# Patient Record
Sex: Female | Born: 1985
Health system: Southern US, Community
[De-identification: ages and names within clinical notes are randomized; demographics above are authoritative.]

## PROBLEM LIST (undated history)

## (undated) DIAGNOSIS — IMO0002 Reserved for concepts with insufficient information to code with codable children: Secondary | ICD-10-CM

## (undated) DIAGNOSIS — F988 Other specified behavioral and emotional disorders with onset usually occurring in childhood and adolescence: Secondary | ICD-10-CM

## (undated) DIAGNOSIS — Z973 Presence of spectacles and contact lenses: Secondary | ICD-10-CM

## (undated) DIAGNOSIS — N939 Abnormal uterine and vaginal bleeding, unspecified: Secondary | ICD-10-CM

## (undated) DIAGNOSIS — D259 Leiomyoma of uterus, unspecified: Secondary | ICD-10-CM

## (undated) DIAGNOSIS — A64 Unspecified sexually transmitted disease: Secondary | ICD-10-CM

## (undated) DIAGNOSIS — M1990 Unspecified osteoarthritis, unspecified site: Secondary | ICD-10-CM

## (undated) DIAGNOSIS — F329 Major depressive disorder, single episode, unspecified: Secondary | ICD-10-CM

## (undated) DIAGNOSIS — L237 Allergic contact dermatitis due to plants, except food: Secondary | ICD-10-CM

## (undated) DIAGNOSIS — N920 Excessive and frequent menstruation with regular cycle: Secondary | ICD-10-CM

## (undated) DIAGNOSIS — F32A Depression, unspecified: Secondary | ICD-10-CM

## (undated) HISTORY — DX: Depression, unspecified: F32.A

## (undated) HISTORY — DX: Allergic contact dermatitis due to plants, except food: L23.7

## (undated) HISTORY — DX: Unspecified sexually transmitted disease: A64

## (undated) HISTORY — DX: Other specified behavioral and emotional disorders with onset usually occurring in childhood and adolescence: F98.8

## (undated) HISTORY — PX: NO PAST SURGERIES: SHX2092

## (undated) HISTORY — DX: Reserved for concepts with insufficient information to code with codable children: IMO0002

## (undated) HISTORY — DX: Major depressive disorder, single episode, unspecified: F32.9

---
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
## 2004-02-08 ENCOUNTER — Ambulatory Visit: Payer: Self-pay | Admitting: Pediatrics

## 2004-02-19 DIAGNOSIS — A64 Unspecified sexually transmitted disease: Secondary | ICD-10-CM

## 2004-02-19 DIAGNOSIS — Z8619 Personal history of other infectious and parasitic diseases: Secondary | ICD-10-CM

## 2004-02-19 HISTORY — DX: Unspecified sexually transmitted disease: A64

## 2004-02-19 HISTORY — DX: Personal history of other infectious and parasitic diseases: Z86.19

## 2004-03-05 ENCOUNTER — Other Ambulatory Visit: Admission: RE | Admit: 2004-03-05 | Discharge: 2004-03-05 | Payer: Self-pay | Admitting: Obstetrics and Gynecology

## 2004-06-04 ENCOUNTER — Ambulatory Visit: Payer: Self-pay | Admitting: Pediatrics

## 2005-02-06 ENCOUNTER — Ambulatory Visit: Payer: Self-pay | Admitting: Pediatrics

## 2007-03-02 ENCOUNTER — Other Ambulatory Visit: Admission: RE | Admit: 2007-03-02 | Discharge: 2007-03-02 | Payer: Self-pay | Admitting: Obstetrics and Gynecology

## 2008-05-02 ENCOUNTER — Emergency Department (HOSPITAL_COMMUNITY): Admission: EM | Admit: 2008-05-02 | Discharge: 2008-05-02 | Payer: Self-pay | Admitting: Emergency Medicine

## 2008-05-16 ENCOUNTER — Other Ambulatory Visit: Admission: RE | Admit: 2008-05-16 | Discharge: 2008-05-16 | Payer: Self-pay | Admitting: Obstetrics and Gynecology

## 2008-12-10 ENCOUNTER — Emergency Department (HOSPITAL_COMMUNITY): Admission: EM | Admit: 2008-12-10 | Discharge: 2008-12-10 | Payer: Self-pay | Admitting: Emergency Medicine

## 2008-12-23 ENCOUNTER — Ambulatory Visit: Payer: Self-pay | Admitting: Internal Medicine

## 2008-12-23 DIAGNOSIS — L301 Dyshidrosis [pompholyx]: Secondary | ICD-10-CM | POA: Insufficient documentation

## 2008-12-23 DIAGNOSIS — F341 Dysthymic disorder: Secondary | ICD-10-CM | POA: Insufficient documentation

## 2008-12-30 ENCOUNTER — Telehealth (INDEPENDENT_AMBULATORY_CARE_PROVIDER_SITE_OTHER): Payer: Self-pay | Admitting: *Deleted

## 2009-01-03 ENCOUNTER — Ambulatory Visit: Payer: Self-pay | Admitting: Licensed Clinical Social Worker

## 2009-01-10 ENCOUNTER — Ambulatory Visit: Payer: Self-pay | Admitting: Licensed Clinical Social Worker

## 2009-01-20 ENCOUNTER — Ambulatory Visit: Payer: Self-pay | Admitting: Internal Medicine

## 2009-01-20 LAB — CONVERTED CEMR LAB
CO2: 33 meq/L — ABNORMAL HIGH (ref 19–32)
Chloride: 100 meq/L (ref 96–112)
Creatinine, Ser: 0.8 mg/dL (ref 0.4–1.2)
LDL Cholesterol: 106 mg/dL — ABNORMAL HIGH (ref 0–99)
Potassium: 4.1 meq/L (ref 3.5–5.1)
Sodium: 139 meq/L (ref 135–145)
Total CHOL/HDL Ratio: 3

## 2009-01-21 ENCOUNTER — Encounter: Payer: Self-pay | Admitting: Internal Medicine

## 2009-01-24 ENCOUNTER — Ambulatory Visit: Payer: Self-pay | Admitting: Licensed Clinical Social Worker

## 2009-03-21 ENCOUNTER — Ambulatory Visit: Payer: Self-pay | Admitting: Licensed Clinical Social Worker

## 2009-03-24 ENCOUNTER — Ambulatory Visit: Payer: Self-pay | Admitting: Internal Medicine

## 2009-04-05 ENCOUNTER — Ambulatory Visit: Payer: Self-pay | Admitting: Internal Medicine

## 2009-04-05 LAB — CONVERTED CEMR LAB: Rapid Strep: NEGATIVE

## 2009-08-09 ENCOUNTER — Ambulatory Visit: Payer: Self-pay | Admitting: Internal Medicine

## 2010-03-20 NOTE — Assessment & Plan Note (Signed)
Summary: INJ/GARDISIL--MEN  STC  Nurse Visit   Allergies: No Known Drug Allergies  Immunizations Administered:  HPV # 3:    Vaccine Type: Gardasil    Site: right deltoid    Mfr: Merck    Dose: 0.5 ml    Route: IM    Given by: Ami Bullins CMA    Exp. Date: 12/03/2010    Lot #: [id number]    VIS given: 03/22/05 version given August 09, 2009.  Orders Added: 1)  HPV Vaccine - 3 sched doses - IM [90649] 2)  Admin 1st Vaccine [60454]

## 2010-03-20 NOTE — Assessment & Plan Note (Signed)
Summary: PER PT FEB FU--STC   Vital Signs:  Patient profile:   25 year old female Height:      63 inches Weight:      148 pounds BMI:     26.31 O2 Sat:      97 % on Room air Temp:     98.5 degrees F oral Pulse rate:   82 / minute BP sitting:   118 / 82  (left arm) Cuff size:   regular  Vitals Entered By: Bill Salinas CMA (March 24, 2009 3:26 PM)  O2 Flow:  Room air   Primary Care Provider:  Jacques Navy MD   History of Present Illness: follow-up for management of anxiety and depression. she has been doing better with fluoxetine. She is interested in stopping medication at this time.  She complains of failure of her grip followed by paresthesia at the base of the thumbs. When lifting heavy objects at work or doing hard repetitive motion work she will find that she looses her grip followed by the sudden on-set of "pins & needles" in her thumb that will last 5-10 minutes. this will happen 2-3 times a month. No intercurrent symptoms.   Current Medications (verified): 1)  Fluoxetine Hcl 20 Mg Caps (Fluoxetine Hcl) .Marland Kitchen.. 1 By Mouth At Bedtime  Allergies (verified): No Known Drug Allergies PMH-FH-SH reviewed-no changes except otherwise noted  Review of Systems  The patient denies anorexia, fever, weight loss, hoarseness, chest pain, dyspnea on exertion, headaches, abdominal pain, muscle weakness, difficulty walking, abnormal bleeding, and angioedema.    Physical Exam  General:  Well-developed,well-nourished,in no acute distress; alert,appropriate and cooperative throughout examination Head:  normocephalic and no abnormalities observed.   Eyes:  vision grossly intact and pupils equal.   Ears:  R ear normal and L ear normal.   Mouth:  good dentition and no gingival abnormalities.   Neck:  supple and full ROM.   Msk:  negative tinel's, negative Phalen's, normal grip strength, normal "O" test.   Impression & Recommendations:  Problem # 1:  DEPRESSION/ANXIETY  (ICD-300.4) Doing much better by her report.  Plan - stop fluoxetine. For recurrent symptoms may resume medication. Have asked her to notify me if she resumes medication.  Problem # 2:  PARESTHESIA, HANDS (ICD-782.0) Normal exam. suspect discomfort is a consequence of hand strain. No additional testing or intervention at this time.   Other Orders: HPV Vaccine - 3 sched doses - IM (40347) Admin 1st Vaccine (42595)   Immunizations Administered:  HPV # 2:    Vaccine Type: Gardasil    Site: left deltoid    Mfr: Merck    Dose: 0.5 ml    Route: IM    Given by: Ami Bullins CMA    Exp. Date: 12/03/2010    Lot #: [id number]    VIS given: 03/22/05 version given March 24, 2009.

## 2010-03-20 NOTE — Assessment & Plan Note (Signed)
Summary: ?STREP THROAT/OFFERED SDA REFUSED/CD   Vital Signs:  Patient profile:   25 year old female Height:      63 inches Weight:      149 pounds BMI:     26.49 O2 Sat:      97 % on Room air Temp:     98.4 degrees F oral Pulse rate:   74 / minute BP sitting:   118 / 82  (left arm) Cuff size:   regular  Vitals Entered By: Bill Salinas CMA (April 05, 2009 11:28 AM)  O2 Flow:  Room air CC: pt here with complaint of weakness and sore throat with welps on her tongue x 2 days/ ab   Primary Care Provider:  Jacques Navy MD  CC:  pt here with complaint of weakness and sore throat with welps on her tongue x 2 days/ ab.  History of Present Illness: c/o feeling ill and low on energy for 6+ weeks. she has now got a sore throat and tongue. she denies running fever, cough, SOB, enlarged lymph nodes.  Current Medications (verified): 1)  None  Allergies (verified): No Known Drug Allergies PMH-FH-SH reviewed-no changes except otherwise noted  Review of Systems  The patient denies anorexia, fever, weight loss, hoarseness, chest pain, dyspnea on exertion, abdominal pain, muscle weakness, depression, enlarged lymph nodes, and angioedema.    Physical Exam  General:  WNWD white female in no distress Head:  Normocephalic and atraumatic without obvious abnormalities. No apparent alopecia or balding. Mouth:  tongue with normal papillation. No lesions. Posterior pharynx normal   Impression & Recommendations:  Problem # 1:  SORE THROAT (ICD-462) tient without evidence of bacterial pharyngits - suspect viral illness with sore tongue. No need for antibiotics. She may also have some degree of allergy  Plan - supportive care only           trial of claritin 10 mg daily  Laboratory Results   Date/Time Reported: 04/05/2009 11:40am  Other Tests  Rapid Strep: negative

## 2010-11-03 ENCOUNTER — Encounter: Payer: Self-pay | Admitting: Internal Medicine

## 2010-11-03 DIAGNOSIS — Z0000 Encounter for general adult medical examination without abnormal findings: Secondary | ICD-10-CM

## 2010-11-12 ENCOUNTER — Encounter: Payer: Self-pay | Admitting: Internal Medicine

## 2010-11-13 ENCOUNTER — Other Ambulatory Visit (INDEPENDENT_AMBULATORY_CARE_PROVIDER_SITE_OTHER): Payer: Self-pay

## 2010-11-13 ENCOUNTER — Other Ambulatory Visit: Payer: Self-pay

## 2010-11-13 ENCOUNTER — Ambulatory Visit (INDEPENDENT_AMBULATORY_CARE_PROVIDER_SITE_OTHER): Payer: Self-pay | Admitting: Internal Medicine

## 2010-11-13 ENCOUNTER — Other Ambulatory Visit (HOSPITAL_COMMUNITY)
Admission: RE | Admit: 2010-11-13 | Discharge: 2010-11-13 | Disposition: A | Discharge status: Home / Self Care | Payer: Self-pay | Source: Ambulatory Visit | Attending: Internal Medicine | Admitting: Internal Medicine

## 2010-11-13 DIAGNOSIS — Z0000 Encounter for general adult medical examination without abnormal findings: Secondary | ICD-10-CM

## 2010-11-13 DIAGNOSIS — N898 Other specified noninflammatory disorders of vagina: Secondary | ICD-10-CM

## 2010-11-13 DIAGNOSIS — Z113 Encounter for screening for infections with a predominantly sexual mode of transmission: Secondary | ICD-10-CM | POA: Insufficient documentation

## 2010-11-13 DIAGNOSIS — Z01419 Encounter for gynecological examination (general) (routine) without abnormal findings: Secondary | ICD-10-CM

## 2010-11-13 DIAGNOSIS — Z1159 Encounter for screening for other viral diseases: Secondary | ICD-10-CM | POA: Insufficient documentation

## 2010-11-13 DIAGNOSIS — Z124 Encounter for screening for malignant neoplasm of cervix: Secondary | ICD-10-CM

## 2010-11-13 LAB — CBC WITH DIFFERENTIAL/PLATELET
Basophils Relative: 0.7 % (ref 0.0–3.0)
Eosinophils Relative: 1.9 % (ref 0.0–5.0)
HCT: 43.1 % (ref 36.0–46.0)
Hemoglobin: 14.5 g/dL (ref 12.0–15.0)
Lymphs Abs: 2.1 10*3/uL (ref 0.7–4.0)
Monocytes Relative: 7.9 % (ref 3.0–12.0)
Platelets: 261 10*3/uL (ref 150.0–400.0)
RBC: 4.93 Mil/uL (ref 3.87–5.11)
WBC: 5.8 10*3/uL (ref 4.5–10.5)

## 2010-11-13 LAB — URINALYSIS, ROUTINE W REFLEX MICROSCOPIC
Nitrite: NEGATIVE
Total Protein, Urine: NEGATIVE
pH: 6 (ref 5.0–8.0)

## 2010-11-13 LAB — COMPREHENSIVE METABOLIC PANEL
Albumin: 4.3 g/dL (ref 3.5–5.2)
BUN: 12 mg/dL (ref 6–23)
CO2: 27 mEq/L (ref 19–32)
GFR: 111.37 mL/min (ref >60.00)
Glucose, Bld: 102 mg/dL — ABNORMAL HIGH (ref 70–99)
Sodium: 139 mEq/L (ref 135–145)
Total Bilirubin: 0.5 mg/dL (ref 0.3–1.2)
Total Protein: 8 g/dL (ref 6.0–8.3)

## 2010-11-13 LAB — LIPID PANEL
Cholesterol: 161 mg/dL (ref 0–200)
VLDL: 18.8 mg/dL (ref 0.0–40.0)
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
## 2010-11-14 ENCOUNTER — Encounter: Payer: Self-pay | Admitting: Internal Medicine

## 2010-11-14 ENCOUNTER — Telehealth: Payer: Self-pay | Admitting: *Deleted

## 2010-11-14 DIAGNOSIS — Z01419 Encounter for gynecological examination (general) (routine) without abnormal findings: Secondary | ICD-10-CM | POA: Insufficient documentation

## 2010-11-14 DIAGNOSIS — N898 Other specified noninflammatory disorders of vagina: Secondary | ICD-10-CM | POA: Insufficient documentation

## 2010-11-14 MED ORDER — METRONIDAZOLE 500 MG PO TABS: 500.0000 mg | ORAL_TABLET | Freq: Two times a day (BID) | ORAL | Status: DC

## 2010-11-14 NOTE — Assessment & Plan Note (Signed)
Persistent discharge - wet prep revealed bacteria and white blood cells c/w - bacterial vaginosis. Chlamydia/GC pending  Plan - plan flagyl 500 mg bid x 7

## 2010-11-14 NOTE — Assessment & Plan Note (Addendum)
Interval hx notable for vaginitis. No other active problems at today's visit. Physical exam was normal including breast exam and pelvic exam. Lab results are normal. Immunizations - current.  In summary - a nice young woman who is medically stable. She will need follow-up exam in 1 year. If she has 3 normal PAP smears in a row she can drop to q2-3 years. Repeat lab in 4-5 years.

## 2010-11-14 NOTE — Telephone Encounter (Signed)
Pt had called and mentioned that she got the lab to draw an extra tube of blood. She was calling to see if you wanted to add any additional STD testing to her lab work.

## 2010-11-15 ENCOUNTER — Ambulatory Visit: Payer: Self-pay

## 2010-11-15 DIAGNOSIS — Z202 Contact with and (suspected) exposure to infections with a predominantly sexual mode of transmission: Secondary | ICD-10-CM

## 2010-11-15 NOTE — Telephone Encounter (Signed)
I filled out add on and walked down to lab. I handed walk in sheet to Deb in the lab

## 2010-11-15 NOTE — Telephone Encounter (Signed)
She is concerned: add VDRL, HIV  Code V 01.6

## 2010-11-16 ENCOUNTER — Other Ambulatory Visit: Payer: Self-pay | Admitting: *Deleted

## 2010-11-16 DIAGNOSIS — N898 Other specified noninflammatory disorders of vagina: Secondary | ICD-10-CM

## 2010-11-16 LAB — OBSTETRIC PANEL: Rubella: 388.2 IU/mL — ABNORMAL HIGH

## 2010-11-16 MED ORDER — METRONIDAZOLE 500 MG PO TABS
500.0000 mg | ORAL_TABLET | Freq: Two times a day (BID) | ORAL | Status: DC
Start: 2010-11-16 — End: 2010-11-16

## 2010-11-16 MED ORDER — METRONIDAZOLE 500 MG PO TABS: 500.0000 mg | ORAL_TABLET | Freq: Two times a day (BID) | ORAL | Status: AC

## 2010-11-18 ENCOUNTER — Telehealth: Payer: Self-pay | Admitting: Internal Medicine

## 2010-11-18 ENCOUNTER — Encounter: Payer: Self-pay | Admitting: Internal Medicine

## 2010-11-18 NOTE — Telephone Encounter (Signed)
If we haven't already- she needs flagyl 500mg  bid for 7 days for BV - I think I snet this to you already

## 2010-11-19 ENCOUNTER — Other Ambulatory Visit: Payer: Self-pay | Admitting: Internal Medicine

## 2010-11-19 ENCOUNTER — Encounter: Payer: Self-pay | Admitting: Internal Medicine

## 2010-11-21 LAB — CBC WITH DIFFERENTIAL/PLATELET
Basophils Relative: 1 % (ref 0–1)
Eosinophils Absolute: 0.1 10*3/uL (ref 0.0–0.7)
Eosinophils Relative: 1 % (ref 0–5)
Hemoglobin: 13.4 g/dL (ref 12.0–15.0)
MCH: 29 pg (ref 26.0–34.0)
MCHC: 32.1 g/dL (ref 30.0–36.0)
MCV: 90.5 fL (ref 78.0–100.0)
Monocytes Absolute: 0.6 10*3/uL (ref 0.1–1.0)
Monocytes Relative: 8 % (ref 3–12)
Neutrophils Relative %: 58 % (ref 43–77)

## 2010-11-22 LAB — ABO AND RH

## 2010-11-22 LAB — ANTIBODY SCREEN: Antibody Screen: NEGATIVE

## 2010-12-05 IMAGING — CR DG FOOT COMPLETE 3+V*R*
3 series · 3 of 3 positions shown · non-contrast
Comparison: None

CLINICAL DATA: Dropped heavy object on foot.

RIGHT FOOT COMPLETE - 3+ VIEW

[view not recorded (1 of 3)]
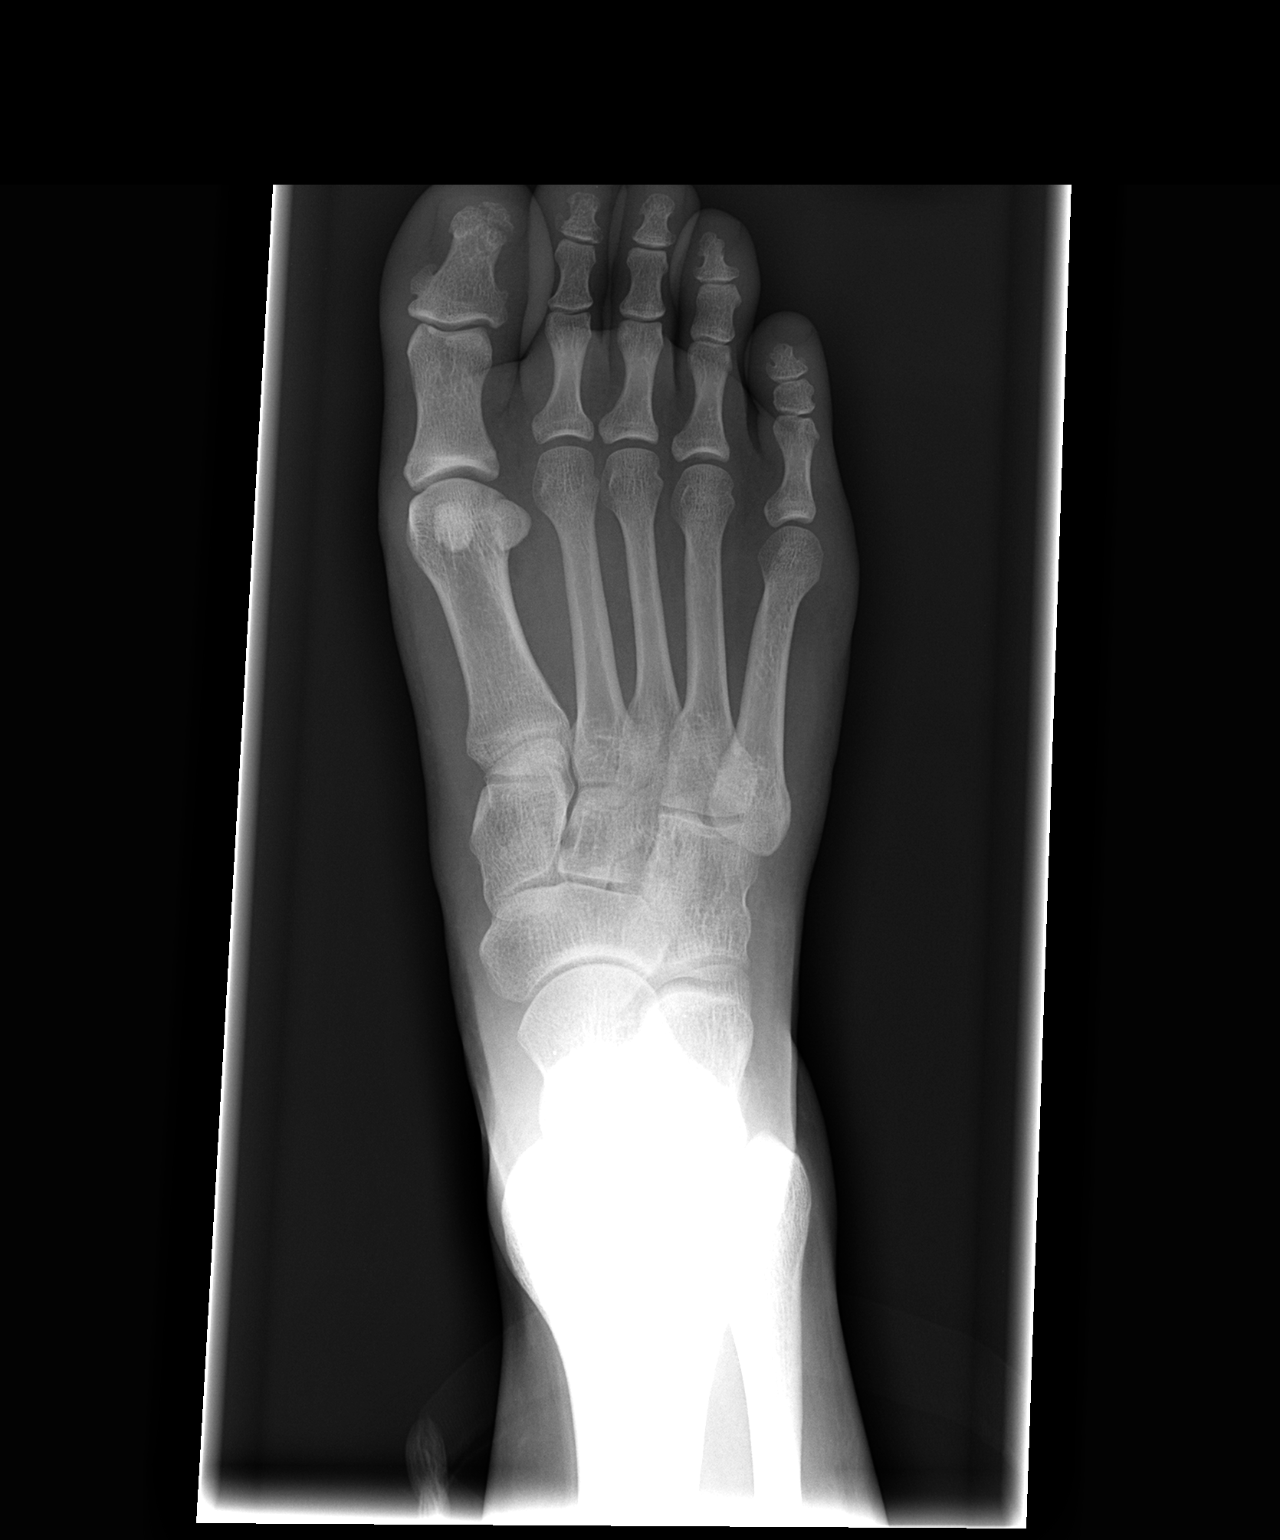

[view not recorded (2 of 3)]
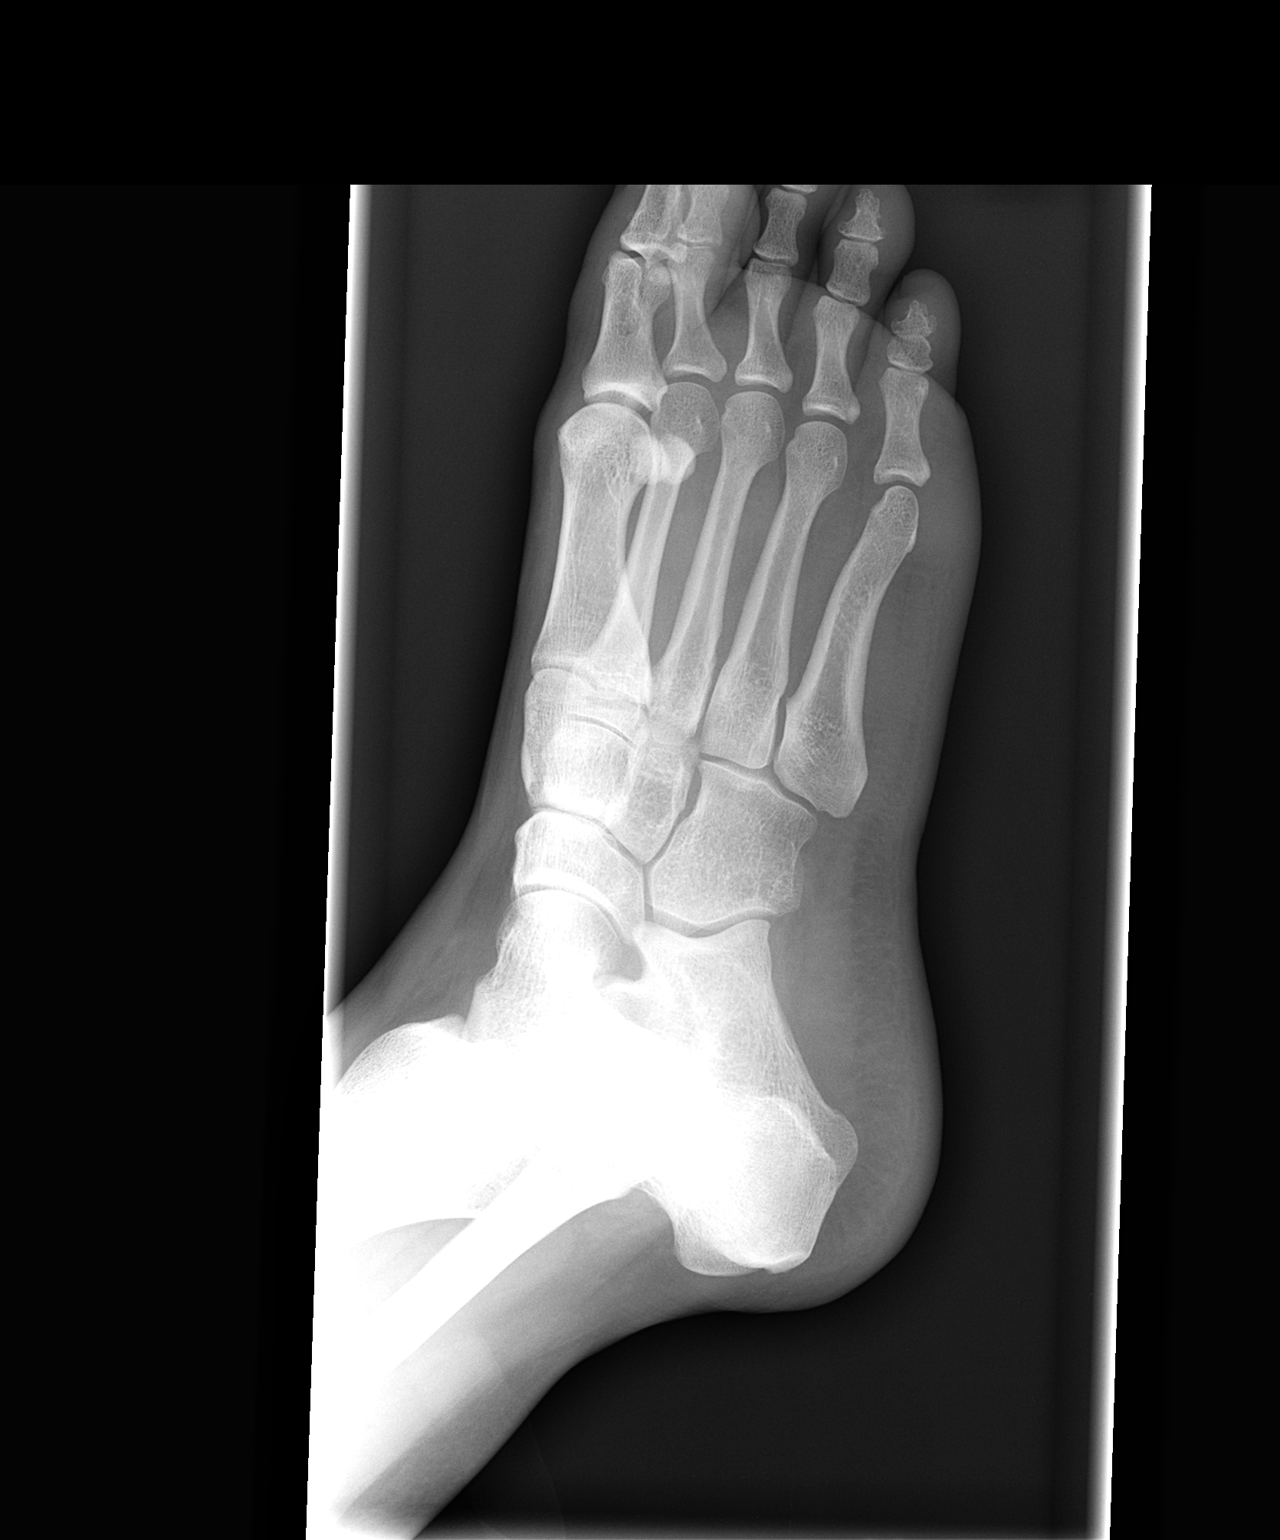

[view not recorded (3 of 3)]
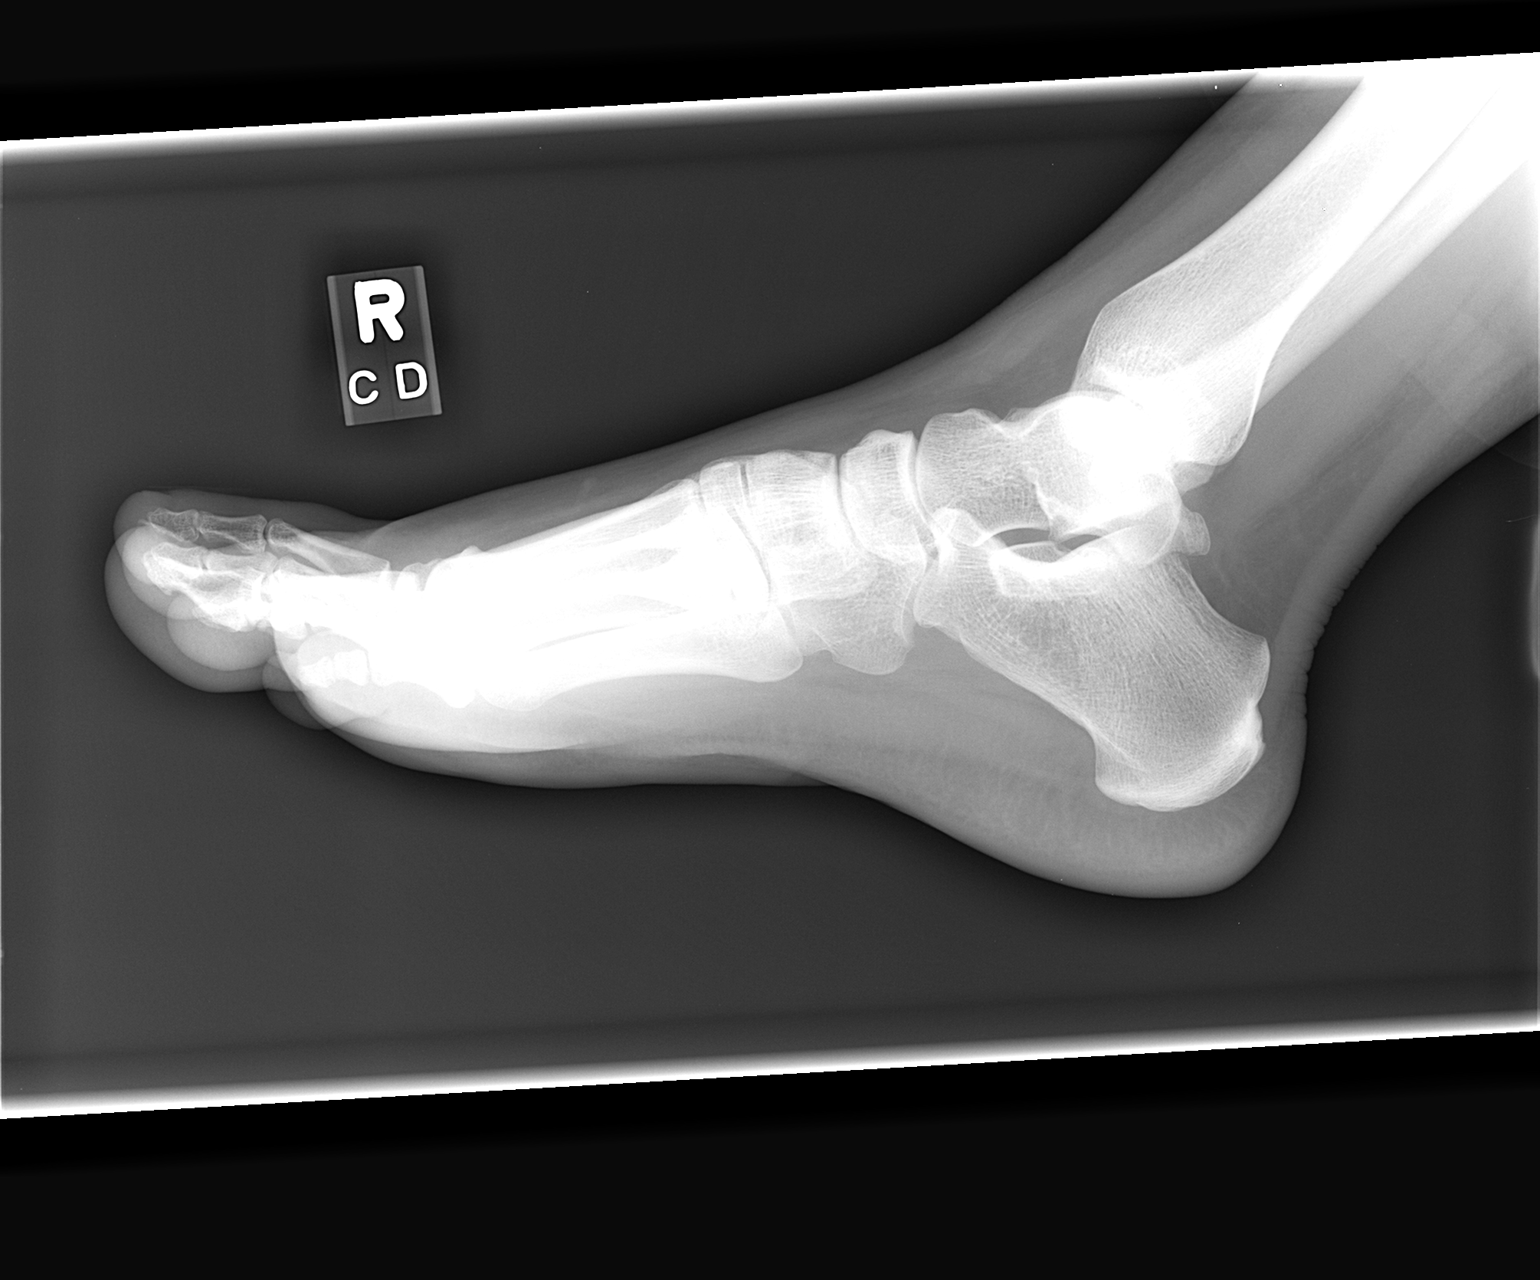

[3 of 3 positions shown; findings below may reference images not displayed]

FINDINGS: There are distal tuft fractures of the first and second
toes.  No joint involvement.  No other significant bony findings.
IMPRESSION: Distal tuft fractures of the first and second toes.

## 2011-11-18 ENCOUNTER — Encounter: Payer: Self-pay | Admitting: Internal Medicine

## 2012-01-22 ENCOUNTER — Ambulatory Visit (INDEPENDENT_AMBULATORY_CARE_PROVIDER_SITE_OTHER): Payer: Self-pay | Admitting: Internal Medicine

## 2012-01-22 ENCOUNTER — Other Ambulatory Visit (HOSPITAL_COMMUNITY)
Admission: RE | Admit: 2012-01-22 | Discharge: 2012-01-22 | Disposition: A | Discharge status: Home / Self Care | Payer: Self-pay | Source: Ambulatory Visit | Attending: Internal Medicine | Admitting: Internal Medicine

## 2012-01-22 ENCOUNTER — Encounter: Payer: Self-pay | Admitting: Internal Medicine

## 2012-01-22 ENCOUNTER — Other Ambulatory Visit (INDEPENDENT_AMBULATORY_CARE_PROVIDER_SITE_OTHER): Payer: Self-pay

## 2012-01-22 VITALS — BP 110/70 | HR 80 | Temp 98.6°F | Resp 10 | Ht 63.0 in | Wt 169.0 lb

## 2012-01-22 DIAGNOSIS — Z0000 Encounter for general adult medical examination without abnormal findings: Secondary | ICD-10-CM

## 2012-01-22 DIAGNOSIS — Z1151 Encounter for screening for human papillomavirus (HPV): Secondary | ICD-10-CM | POA: Insufficient documentation

## 2012-01-22 DIAGNOSIS — N760 Acute vaginitis: Secondary | ICD-10-CM | POA: Insufficient documentation

## 2012-01-22 DIAGNOSIS — Z01419 Encounter for gynecological examination (general) (routine) without abnormal findings: Secondary | ICD-10-CM

## 2012-01-22 DIAGNOSIS — N898 Other specified noninflammatory disorders of vagina: Secondary | ICD-10-CM

## 2012-01-22 DIAGNOSIS — Z124 Encounter for screening for malignant neoplasm of cervix: Secondary | ICD-10-CM

## 2012-01-22 DIAGNOSIS — Z113 Encounter for screening for infections with a predominantly sexual mode of transmission: Secondary | ICD-10-CM | POA: Insufficient documentation

## 2012-01-22 DIAGNOSIS — R87810 Cervical high risk human papillomavirus (HPV) DNA test positive: Secondary | ICD-10-CM | POA: Insufficient documentation

## 2012-01-22 LAB — CBC WITH DIFFERENTIAL/PLATELET
Basophils Absolute: 0 10*3/uL (ref 0.0–0.1)
Eosinophils Absolute: 0.1 10*3/uL (ref 0.0–0.7)
HCT: 41.2 % (ref 36.0–46.0)
Lymphs Abs: 2.1 10*3/uL (ref 0.7–4.0)
MCHC: 33.3 g/dL (ref 30.0–36.0)
MCV: 87.4 fl (ref 78.0–100.0)
Monocytes Absolute: 0.5 10*3/uL (ref 0.1–1.0)
Platelets: 303 10*3/uL (ref 150.0–400.0)
RDW: 12.4 % (ref 11.5–14.6)

## 2012-01-22 LAB — HIV ANTIBODY (ROUTINE TESTING W REFLEX): HIV: NONREACTIVE
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
## 2012-01-22 NOTE — Patient Instructions (Addendum)
Thanks for coming to se me.  You will receive a full report of the exam and labs  You have lateral epicondylitis right elbow - an overuse inflammation of the tendon. Treatment: rub of choice, heat, reduce use if possible.   Lateral Epicondylitis (Tennis Elbow) with Rehab Lateral epicondylitis involves inflammation and pain around the outer portion of the elbow. The pain is caused by inflammation of the tendons in the forearm that bring back (extend) the wrist. Lateral epicondylittis is also called tennis elbow, because it is very common in tennis players. However, it may occur in any individual who extends the wrist repetitively. If lateral epicondylitis is left untreated, it may become a chronic problem. SYMPTOMS    Pain, tenderness, and inflammation on the outer (lateral) side of the elbow.   Pain or weakness with gripping activities.   Pain that increases with wrist twisting motions (playing tennis, using a screwdriver, opening a door or a jar).   Pain with lifting objects, including a coffee cup.  CAUSES   Lateral epicondylitis is caused by inflammation of the tendons that extend the wrist. Causes of injury may include:  Repetitive stress and strain on the muscles and tendons that extend the wrist.   Sudden change in activity level or intensity.   Incorrect grip in racquet sports.   Incorrect grip size of racquet (often too large).   Incorrect hitting position or technique (usually backhand, leading with the elbow).   Using a racket that is too heavy.  RISK INCREASES WITH:  Sports or occupations that require repetitive and/or strenuous forearm and wrist movements (tennis, squash, racquetball, carpentry).   Poor wrist and forearm strength and flexibility.   Failure to warm up properly before activity.   Resuming activity before healing, rehabilitation, and conditioning are complete.  PREVENTION    Warm up and stretch properly before activity.   Maintain physical  fitness:   Strength, flexibility, and endurance.   Cardiovascular fitness.   Wear and use properly fitted equipment.   Learn and use proper technique and have a coach correct improper technique.   Wear a tennis elbow (counterforce) brace.  PROGNOSIS   The course of this condition depends on the degree of the injury. If treated properly, acute cases (symptoms lasting less than 4 weeks) are often resolved in 2 to 6 weeks. Chronic (longer lasting cases) often resolve in 3 to 6 months, but may require physical therapy. RELATED COMPLICATIONS    Frequently recurring symptoms, resulting in a chronic problem. Properly treating the problem the first time decreases frequency of recurrence.   Chronic inflammation, scarring tendon degeneration, and partial tendon tear, requiring surgery.   Delayed healing or resolution of symptoms.  TREATMENT   Treatment first involves the use of ice and medicine, to reduce pain and inflammation. Strengthening and stretching exercises may help reduce discomfort, if performed regularly. These exercises may be performed at home, if the condition is an acute injury. Chronic cases may require a referral to a physical therapist for evaluation and treatment. Your caregiver may advise a corticosteroid injection, to help reduce inflammation. Rarely, surgery is needed. MEDICATION  If pain medicine is needed, nonsteroidal anti-inflammatory medicines (aspirin and ibuprofen), or other minor pain relievers (acetaminophen), are often advised.   Do not take pain medicine for 7 days before surgery.   Prescription pain relievers may be given, if your caregiver thinks they are needed. Use only as directed and only as much as you need.   Corticosteroid injections may be recommended.  These injections should be reserved only for the most severe cases, because they can only be given a certain number of times.  HEAT AND COLD  Cold treatment (icing) should be applied for 10 to 15  minutes every 2 to 3 hours for inflammation and pain, and immediately after activity that aggravates your symptoms. Use ice packs or an ice massage.   Heat treatment may be used before performing stretching and strengthening activities prescribed by your caregiver, physical therapist, or athletic trainer. Use a heat pack or a warm water soak.  SEEK MEDICAL CARE IF: Symptoms get worse or do not improve in 2 weeks, despite treatment. EXERCISES   RANGE OF MOTION (ROM) AND STRETCHING EXERCISES - Epicondylitis, Lateral (Tennis Elbow) These exercises may help you when beginning to rehabilitate your injury. Your symptoms may go away with or without further involvement from your physician, physical therapist or athletic trainer. While completing these exercises, remember:    Restoring tissue flexibility helps normal motion to return to the joints. This allows healthier, less painful movement and activity.   An effective stretch should be held for at least 30 seconds.   A stretch should never be painful. You should only feel a gentle lengthening or release in the stretched tissue.  RANGE OF MOTION  Wrist Flexion, Active-Assisted  Extend your right / left elbow with your fingers pointing down.*   Gently pull the back of your hand towards you, until you feel a gentle stretch on the top of your forearm.   Hold this position for __________ seconds.  Repeat __________ times. Complete this exercise __________ times per day.   *If directed by your physician, physical therapist or athletic trainer, complete this stretch with your elbow bent, rather than extended. RANGE OF MOTION  Wrist Extension, Active-Assisted  Extend your right / left elbow and turn your palm upwards.*   Gently pull your palm and fingertips back, so your wrist extends and your fingers point more toward the ground.   You should feel a gentle stretch on the inside of your forearm.   Hold this position for __________ seconds.  Repeat  __________ times. Complete this exercise __________ times per day. *If directed by your physician, physical therapist or athletic trainer, complete this stretch with your elbow bent, rather than extended. STRETCH - Wrist Flexion  Place the back of your right / left hand on a tabletop, leaving your elbow slightly bent. Your fingers should point away from your body.   Gently press the back of your hand down onto the table by straightening your elbow. You should feel a stretch on the top of your forearm.   Hold this position for __________ seconds.  Repeat __________ times. Complete this stretch __________ times per day.   STRETCH  Wrist Extension   Place your right / left fingertips on a tabletop, leaving your elbow slightly bent. Your fingers should point backwards.   Gently press your fingers and palm down onto the table by straightening your elbow. You should feel a stretch on the inside of your forearm.   Hold this position for __________ seconds.  Repeat __________ times. Complete this stretch __________ times per day.   STRENGTHENING EXERCISES - Epicondylitis, Lateral (Tennis Elbow) These exercises may help you when beginning to rehabilitate your injury. They may resolve your symptoms with or without further involvement from your physician, physical therapist or athletic trainer. While completing these exercises, remember:    Muscles can gain both the endurance and the strength needed  for everyday activities through controlled exercises.   Complete these exercises as instructed by your physician, physical therapist or athletic trainer. Increase the resistance and repetitions only as guided.   You may experience muscle soreness or fatigue, but the pain or discomfort you are trying to eliminate should never worsen during these exercises. If this pain does get worse, stop and make sure you are following the directions exactly. If the pain is still present after adjustments, discontinue the  exercise until you can discuss the trouble with your caregiver.  STRENGTH Wrist Flexors  Sit with your right / left forearm palm-up and fully supported on a table or countertop. Your elbow should be resting below the height of your shoulder. Allow your wrist to extend over the edge of the surface.   Loosely holding a __________ weight, or a piece of rubber exercise band or tubing, slowly curl your hand up toward your forearm.   Hold this position for __________ seconds. Slowly lower the wrist back to the starting position in a controlled manner.  Repeat __________ times. Complete this exercise __________ times per day.   STRENGTH  Wrist Extensors  Sit with your right / left forearm palm-down and fully supported on a table or countertop. Your elbow should be resting below the height of your shoulder. Allow your wrist to extend over the edge of the surface.   Loosely holding a __________ weight, or a piece of rubber exercise band or tubing, slowly curl your hand up toward your forearm.   Hold this position for __________ seconds. Slowly lower the wrist back to the starting position in a controlled manner.  Repeat __________ times. Complete this exercise __________ times per day.   STRENGTH - Ulnar Deviators  Stand with a ____________________ weight in your right / left hand, or sit while holding a rubber exercise band or tubing, with your healthy arm supported on a table or countertop.   Move your wrist, so that your pinkie travels toward your forearm and your thumb moves away from your forearm.   Hold this position for __________ seconds and then slowly lower the wrist back to the starting position.  Repeat __________ times. Complete this exercise __________ times per day STRENGTH - Radial Deviators  Stand with a ____________________ weight in your right / left hand, or sit while holding a rubber exercise band or tubing, with your injured arm supported on a table or countertop.   Raise  your hand upward in front of you or pull up on the rubber tubing.   Hold this position for __________ seconds and then slowly lower the wrist back to the starting position.  Repeat __________ times. Complete this exercise __________ times per day. STRENGTH  Forearm Supinators   Sit with your right / left forearm supported on a table, keeping your elbow below shoulder height. Rest your hand over the edge, palm down.   Gently grip a hammer or a soup ladle.   Without moving your elbow, slowly turn your palm and hand upward to a "thumbs-up" position.   Hold this position for __________ seconds. Slowly return to the starting position.  Repeat __________ times. Complete this exercise __________ times per day.   STRENGTH  Forearm Pronators   Sit with your right / left forearm supported on a table, keeping your elbow below shoulder height. Rest your hand over the edge, palm up.   Gently grip a hammer or a soup ladle.   Without moving your elbow, slowly turn your palm  and hand upward to a "thumbs-up" position.   Hold this position for __________ seconds. Slowly return to the starting position.  Repeat __________ times. Complete this exercise __________ times per day.   STRENGTH - Grip  Grasp a tennis ball, a dense sponge, or a large, rolled sock in your hand.   Squeeze as hard as you can, without increasing any pain.   Hold this position for __________ seconds. Release your grip slowly.  Repeat __________ times. Complete this exercise __________ times per day.   STRENGTH - Elbow Extensors, Isometric  Stand or sit upright, on a firm surface. Place your right / left arm so that your palm faces your stomach, and it is at the height of your waist.   Place your opposite hand on the underside of your forearm. Gently push up as your right / left arm resists. Push as hard as you can with both arms, without causing any pain or movement at your right / left elbow. Hold this stationary position for  __________ seconds.  Gradually release the tension in both arms. Allow your muscles to relax completely before repeating. Document Released: 02/04/2005 Document Revised: 04/29/2011 Document Reviewed: 05/19/2008 [facility] Patient Information 2013 [place], [place].

## 2012-01-23 ENCOUNTER — Encounter: Payer: Self-pay | Admitting: Internal Medicine

## 2012-01-26 NOTE — Assessment & Plan Note (Addendum)
Interval history is benign. Physical exam normal with some vaginal discharge on pelvic. Lab work normal, STD labs negative except for HPV. Wet prep pending. Immunization - up to date.  In summary - a very nice woman who appears to be medically stable at this time. She will return in 1 year for routine exam, sooner if needed.

## 2012-02-05 ENCOUNTER — Encounter: Payer: Self-pay | Admitting: Internal Medicine

## 2012-02-13 ENCOUNTER — Telehealth: Payer: Self-pay | Admitting: Internal Medicine

## 2012-02-13 MED ORDER — METRONIDAZOLE 500 MG PO TABS: 500.0000 mg | ORAL_TABLET | Freq: Two times a day (BID) | ORAL | Status: DC

## 2012-02-13 NOTE — Telephone Encounter (Signed)
Called pt and lvm telling her that the reference lab had not found the wet prep and he is going to treat her sx with metronidazole 500 mg twice a day x 7 days. If she has any other problems give [place] a call.

## 2012-02-13 NOTE — Telephone Encounter (Signed)
Reference lab has not found wet prep: will treat metronidazole 500 mg twice a day x 7 days. Rx sent in.

## 2012-02-13 NOTE — Telephone Encounter (Signed)
Pt was informed that her wet prep was misplaced.  She wants to know if she needs to come in again or if medication can be called in.  She uses [facility]

## 2012-03-25 ENCOUNTER — Other Ambulatory Visit: Payer: Self-pay

## 2012-03-25 ENCOUNTER — Encounter: Payer: Self-pay | Admitting: Internal Medicine

## 2012-03-25 ENCOUNTER — Ambulatory Visit (INDEPENDENT_AMBULATORY_CARE_PROVIDER_SITE_OTHER): Payer: Self-pay | Admitting: Internal Medicine

## 2012-03-25 ENCOUNTER — Other Ambulatory Visit: Payer: Self-pay | Admitting: Internal Medicine

## 2012-03-25 VITALS — BP 114/68 | HR 105 | Temp 98.8°F | Resp 10 | Wt 173.1 lb

## 2012-03-25 DIAGNOSIS — Z202 Contact with and (suspected) exposure to infections with a predominantly sexual mode of transmission: Secondary | ICD-10-CM

## 2012-03-25 DIAGNOSIS — Z9189 Other specified personal risk factors, not elsewhere classified: Secondary | ICD-10-CM
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
## 2012-04-04 ENCOUNTER — Other Ambulatory Visit: Payer: Self-pay

## 2012-04-29 ENCOUNTER — Telehealth: Payer: Self-pay

## 2012-04-29 NOTE — Telephone Encounter (Signed)
Phone call to [facility] lab [phone]) regarding patient's wet prep. It was cancelled by Larita Fife since the wrong swab/probe was used. Gc/chlamydia test was finalized and is being faxed this afternoon.

## 2012-05-01 NOTE — Telephone Encounter (Signed)
Pt has been notified of her negative gonorrhea results. She asked about the wet prep and I let her know it was cancelled due to wrong culture collection swab being used. I told her once I find out if/when we have the correct culture collection swabs I will give her a call. She said okay and had no questions/concerns.

## 2012-05-08 ENCOUNTER — Telehealth: Payer: Self-pay

## 2012-05-08 MED ORDER — METRONIDAZOLE 500 MG PO TABS: 500.0000 mg | ORAL_TABLET | Freq: Two times a day (BID) | ORAL | Status: DC

## 2012-05-08 NOTE — Telephone Encounter (Signed)
I called and let pt know I am sending a script for Flagyl to [facility] on [address].

## 2012-05-08 NOTE — Telephone Encounter (Signed)
Flagyl 500 mg bid x 7 days #14

## 2012-05-08 NOTE — Telephone Encounter (Signed)
Phone call from pt. She states she is still having vaginal itching, discharge, and abdominal pain. She would like a prescription for Flagyl sent to [facility] on [address] and requesting to come in for testing if symptoms do not improve from this round of meds. We do have the proper collection specimens just have to be careful how certain tests are ordered. Please advise.

## 2012-07-27 ENCOUNTER — Ambulatory Visit: Payer: Self-pay | Admitting: Internal Medicine

## 2012-08-13 ENCOUNTER — Ambulatory Visit (INDEPENDENT_AMBULATORY_CARE_PROVIDER_SITE_OTHER): Payer: Self-pay | Admitting: Internal Medicine

## 2012-08-13 ENCOUNTER — Encounter: Payer: Self-pay | Admitting: Internal Medicine

## 2012-08-13 ENCOUNTER — Other Ambulatory Visit: Payer: Self-pay

## 2012-08-13 VITALS — BP 120/80 | HR 77 | Temp 97.8°F | Ht 62.25 in | Wt 161.4 lb

## 2012-08-13 DIAGNOSIS — N898 Other specified noninflammatory disorders of vagina: Secondary | ICD-10-CM

## 2012-08-13 LAB — WET PREP, GENITAL
Clue Cells Wet Prep HPF POC: NONE SEEN
Trich, Wet Prep: NONE SEEN

## 2012-08-14 ENCOUNTER — Other Ambulatory Visit: Payer: Self-pay | Admitting: Internal Medicine

## 2012-08-14 MED ORDER — METRONIDAZOLE 500 MG PO TABS: 500.0000 mg | ORAL_TABLET | Freq: Two times a day (BID) | ORAL | Status: DC

## 2012-08-16 NOTE — Assessment & Plan Note (Signed)
Persistent but light vaginal d/c. She had a heavier discharge about 1 year ago. She has tested negative for GC, Chlamydia.  Wet prep was positive for bacterial vaginosis. Rx sent in for flagyl.

## 2012-08-16 NOTE — Progress Notes (Signed)
  Subjective:    Patient ID: Anna Thornton, female    DOB: 1985/08/09, 27 y.o.   MRN: [id number]  HPI MNs. Elliston returns for repeat Pelvic with wet prep. On two prior occasions the wet prep specimen could not be processed. She continues to have a light discharge.  PMH, FamHx and SocHx reviewed for any changes and relevance. No current outpatient prescriptions on file prior to visit.   No current facility-administered medications on file prior to visit.      Review of Systems System review is negative for any constitutional, cardiac, pulmonary, GI or neuro symptoms or complaints other than as described in the HPI.     Objective:   Physical Exam Filed Vitals:   08/13/12 1510  BP: 120/80  Pulse: 77  Temp: 97.8 F (36.6 C)   Pelvic - NEG/BUS normal, normal entroitus, nulliparous appearing cervix without tenderness, scant discharge posterior vault. Wet prep collected       Assessment & Plan:  Vaginal discharge - wet prep c/w bacterial vaginosis  Plan flagyl 500 mg bid x 7 days.

## 2012-09-21 ENCOUNTER — Ambulatory Visit (INDEPENDENT_AMBULATORY_CARE_PROVIDER_SITE_OTHER)
Admission: RE | Admit: 2012-09-21 | Discharge: 2012-09-21 | Disposition: A | Discharge status: Home / Self Care | Payer: Self-pay | Source: Ambulatory Visit | Attending: Internal Medicine | Admitting: Internal Medicine

## 2012-09-21 ENCOUNTER — Encounter: Payer: Self-pay | Admitting: Internal Medicine

## 2012-09-21 ENCOUNTER — Ambulatory Visit (INDEPENDENT_AMBULATORY_CARE_PROVIDER_SITE_OTHER): Payer: Self-pay | Admitting: Internal Medicine

## 2012-09-21 VITALS — BP 116/84 | HR 84 | Temp 98.2°F | Wt 160.0 lb

## 2012-09-21 DIAGNOSIS — N898 Other specified noninflammatory disorders of vagina: Secondary | ICD-10-CM

## 2012-09-21 MED ORDER — METRONIDAZOLE 500 MG PO TABS: 500.0000 mg | ORAL_TABLET | Freq: Two times a day (BID) | ORAL | Status: DC

## 2012-09-21 NOTE — Assessment & Plan Note (Signed)
Recurrent vaginal discharge. GC, Syphilis, chlamydia ruled out in the last 18 months. She has protected intercourse. Wet prep on 2 occasions positive for bacterial vaginosis. No other gyn history.  Today with recurrent discharge - same as before.  Plan  flagyl - 500 mg bid x 10 days.  For recurrence or worsening lower abdominal pain - gyn referral.
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
## 2012-09-21 NOTE — Patient Instructions (Addendum)
1. Recurrent BV Plan  Flagyl 500 mg bid x 10  Recurrence - gyn referral  2. Skull defect Plan - DGskull series.

## 2012-12-24 ENCOUNTER — Other Ambulatory Visit: Payer: Self-pay

## 2013-01-06 ENCOUNTER — Telehealth: Payer: Self-pay | Admitting: Emergency Medicine

## 2013-01-06 NOTE — Telephone Encounter (Signed)
Patient calling stating that she started her period today and is bleeding, she says days 1-2 are very heavy. Wondering if she can be examined while on heavy menses and will it be possible to see BV on exam while on menses. Spoke with Dr. Farrel Gobble and advised to wait for exam. Patient will have to then be seen as new gyn as it has been more than 3 years since last visit.

## 2013-01-06 NOTE — Telephone Encounter (Signed)
Spoke with Dr. Farrel Gobble, wait for menses to end. May need affirm testing. Spoke with Billie Ruddy and [facility], okay to schedule patient for new visit/established patient/problem visit for next week.  Spoke with patient and she is agreeable.   Routing to provider for final review. Patient agreeable to disposition. Will close encounter

## 2013-01-07 ENCOUNTER — Ambulatory Visit: Payer: Self-pay | Admitting: Nurse Practitioner

## 2013-01-12 ENCOUNTER — Encounter: Payer: Self-pay | Admitting: Nurse Practitioner

## 2013-01-12 ENCOUNTER — Ambulatory Visit (INDEPENDENT_AMBULATORY_CARE_PROVIDER_SITE_OTHER): Payer: Self-pay | Admitting: Nurse Practitioner

## 2013-01-12 VITALS — BP 100/74 | HR 70 | Resp 16 | Ht 62.5 in | Wt 164.0 lb

## 2013-01-12 DIAGNOSIS — A499 Bacterial infection, unspecified: Secondary | ICD-10-CM

## 2013-01-12 DIAGNOSIS — N760 Acute vaginitis: Secondary | ICD-10-CM

## 2013-01-12 DIAGNOSIS — B9689 Other specified bacterial agents as the cause of diseases classified elsewhere: Secondary | ICD-10-CM

## 2013-01-12 MED ORDER — METRONIDAZOLE 0.75 % VA GEL: 1.0000 | Freq: Every day | VAGINAL | Status: DC
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
## 2013-01-12 NOTE — Patient Instructions (Addendum)
Bacterial Vaginosis Bacterial vaginosis (BV) is a vaginal infection where the normal balance of bacteria in the vagina is disrupted. The normal balance is then replaced by an overgrowth of certain bacteria. There are several different kinds of bacteria that can cause BV. BV is the most common vaginal infection in women of childbearing age. CAUSES   The cause of BV is not fully understood. BV develops when there is an increase or imbalance of harmful bacteria.  Some activities or behaviors can upset the normal balance of bacteria in the vagina and put women at increased risk including:  Having a new sex partner or multiple sex partners.  Douching.  Using an intrauterine device (IUD) for contraception.  It is not clear what role sexual activity plays in the development of BV. However, women that have never had sexual intercourse are rarely infected with BV. Women do not get BV from toilet seats, bedding, swimming pools or from touching objects around them.  SYMPTOMS   Grey vaginal discharge.  A fish-like odor with discharge, especially after sexual intercourse.  Itching or burning of the vagina and vulva.  Burning or pain with urination.  Some women have no signs or symptoms at all. DIAGNOSIS  Your caregiver must examine the vagina for signs of BV. Your caregiver will perform lab tests and look at the sample of vaginal fluid through a microscope. They will look for bacteria and abnormal cells (clue cells), a pH test higher than 4.5, and a positive amine test all associated with BV.  RISKS AND COMPLICATIONS   Pelvic inflammatory disease (PID).  Infections following gynecology surgery.  Developing HIV.  Developing herpes virus. TREATMENT  Sometimes BV will clear up without treatment. However, all women with symptoms of BV should be treated to avoid complications, especially if gynecology surgery is planned. Female partners generally do not need to be treated. However, BV may spread  between female sex partners so treatment is helpful in preventing a recurrence of BV.   BV may be treated with antibiotics. The antibiotics come in either pill or vaginal cream forms. Either can be used with nonpregnant or pregnant women, but the recommended dosages differ. These antibiotics are not harmful to the baby.  BV can recur after treatment. If this happens, a second round of antibiotics will often be prescribed.  Treatment is important for pregnant women. If not treated, BV can cause a premature delivery, especially for a pregnant woman who had a premature birth in the past. All pregnant women who have symptoms of BV should be checked and treated.  For chronic reoccurrence of BV, treatment with a type of prescribed gel vaginally twice a week is helpful. HOME CARE INSTRUCTIONS   Finish all medication as directed by your caregiver.  Do not have sex until treatment is completed.  Tell your sexual partner that you have a vaginal infection. They should see their caregiver and be treated if they have problems, such as a mild rash or itching.  Practice safe sex. Use condoms. Only have 1 sex partner. PREVENTION  Basic prevention steps can help reduce the risk of upsetting the natural balance of bacteria in the vagina and developing BV:  Do not have sexual intercourse (be abstinent).  Do not douche.  Use all of the medicine prescribed for treatment of BV, even if the signs and symptoms go away.  Tell your sex partner if you have BV. That way, they can be treated, if needed, to prevent reoccurrence. SEEK MEDICAL CARE IF:     Your symptoms are not improving after 3 days of treatment.  You have increased discharge, pain, or fever. MAKE SURE YOU:   Understand these instructions.  Will watch your condition.  Will get help right away if you are not doing well or get worse. FOR MORE INFORMATION  Division of STD Prevention (DSTDP), Centers for Disease Control and Prevention:  [employer]s.[employer] American Social Health Association (ASHA): www.ashastd.org  Document Released: 02/04/2005 Document Revised: 04/29/2011 Document Reviewed: 09/16/2012 [facility] Patient Information 2014 [place], [place].   Pro B 1 tablet daily

## 2013-01-13 NOTE — Progress Notes (Signed)
Encounter reviewed by Dr. Peighton Mehra Silva.  

## 2013-12-03 ENCOUNTER — Other Ambulatory Visit: Payer: Self-pay

## 2014-03-28 ENCOUNTER — Emergency Department (HOSPITAL_BASED_OUTPATIENT_CLINIC_OR_DEPARTMENT_OTHER): Payer: No Typology Code available for payment source

## 2014-03-28 ENCOUNTER — Encounter (HOSPITAL_BASED_OUTPATIENT_CLINIC_OR_DEPARTMENT_OTHER): Payer: Self-pay

## 2014-03-28 ENCOUNTER — Emergency Department (HOSPITAL_BASED_OUTPATIENT_CLINIC_OR_DEPARTMENT_OTHER)
Admission: EM | Admit: 2014-03-28 | Discharge: 2014-03-28 | Disposition: A | Discharge status: Home / Self Care | Payer: No Typology Code available for payment source | Attending: Emergency Medicine | Admitting: Emergency Medicine

## 2014-03-28 DIAGNOSIS — Z872 Personal history of diseases of the skin and subcutaneous tissue: Secondary | ICD-10-CM | POA: Insufficient documentation

## 2014-03-28 DIAGNOSIS — Z8742 Personal history of other diseases of the female genital tract: Secondary | ICD-10-CM | POA: Diagnosis not present

## 2014-03-28 DIAGNOSIS — S8991XA Unspecified injury of right lower leg, initial encounter: Secondary | ICD-10-CM | POA: Insufficient documentation

## 2014-03-28 DIAGNOSIS — Z8659 Personal history of other mental and behavioral disorders: Secondary | ICD-10-CM | POA: Diagnosis not present

## 2014-03-28 DIAGNOSIS — Z87828 Personal history of other (healed) physical injury and trauma: Secondary | ICD-10-CM | POA: Diagnosis not present

## 2014-03-28 DIAGNOSIS — S299XXA Unspecified injury of thorax, initial encounter: Secondary | ICD-10-CM | POA: Diagnosis not present

## 2014-03-28 DIAGNOSIS — Y92410 Unspecified street and highway as the place of occurrence of the external cause: Secondary | ICD-10-CM | POA: Insufficient documentation

## 2014-03-28 DIAGNOSIS — Y9389 Activity, other specified: Secondary | ICD-10-CM | POA: Diagnosis not present

## 2014-03-28 DIAGNOSIS — Z8619 Personal history of other infectious and parasitic diseases: Secondary | ICD-10-CM | POA: Diagnosis not present

## 2014-03-28 DIAGNOSIS — Y998 Other external cause status: Secondary | ICD-10-CM | POA: Insufficient documentation

## 2014-03-28 DIAGNOSIS — M25561 Pain in right knee: Secondary | ICD-10-CM

## 2014-03-28 MED ORDER — HYDROCODONE-ACETAMINOPHEN 5-325 MG PO TABS
1.0000 | ORAL_TABLET | Freq: Once | ORAL | Status: DC
Start: 2014-03-28 — End: 2014-03-28
  Filled 2014-03-28: qty 1

## 2014-03-28 MED ORDER — NAPROXEN 250 MG PO TABS
500.0000 mg | ORAL_TABLET | Freq: Once | ORAL | Status: AC
  Administered 2014-03-28: 500 mg via ORAL
  Filled 2014-03-28: qty 2

## 2014-03-28 MED ORDER — NAPROXEN 500 MG PO TABS: 500.0000 mg | ORAL_TABLET | Freq: Two times a day (BID) | ORAL | Status: DC

## 2014-03-28 MED ORDER — HYDROCODONE-ACETAMINOPHEN 5-325 MG PO TABS: 1.0000 | ORAL_TABLET | ORAL | Status: DC | PRN

## 2014-03-28 NOTE — Discharge Instructions (Signed)
Please follow the directions provided. Be sure to follow-up with the sports medicine doctor if your knee pain does not improve. May take the naproxen twice a day for pain related to inflammation. You may take the Vicodin for pain not relieved by the naproxen. Your knee sleeve for comfort. You may use ice 20 minutes on 20 minutes off. Please elevate your leg when possible. Don't hesitate to return for any new, worsening, or concerning symptoms.   SEEK IMMEDIATE MEDICAL CARE IF:  You have numbness, tingling, or weakness in the arms or legs.  You develop severe headaches not relieved with medicine.  You have severe neck pain, especially tenderness in the middle of the back of your neck.  You have changes in bowel or bladder control.  There is increasing pain in any area of the body.  You have shortness of breath, light-headedness, dizziness, or fainting.  You have chest pain.  You feel sick to your stomach (nauseous), throw up (vomit), or sweat.  You have increasing abdominal discomfort.  There is blood in your urine, stool, or vomit.  You have pain in your shoulder (shoulder strap areas).  You feel your symptoms are getting worse.

## 2014-03-28 NOTE — ED Notes (Signed)
Pt refused vicodin for now

## 2014-03-28 NOTE — ED Notes (Signed)
MD at bedside. 
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
## 2014-07-21 ENCOUNTER — Encounter: Payer: Self-pay | Admitting: Nurse Practitioner

## 2014-07-21 ENCOUNTER — Ambulatory Visit (INDEPENDENT_AMBULATORY_CARE_PROVIDER_SITE_OTHER): Payer: Self-pay | Admitting: Nurse Practitioner

## 2014-07-21 VITALS — BP 110/80 | HR 92 | Resp 20 | Ht 62.25 in | Wt 166.0 lb

## 2014-07-21 DIAGNOSIS — R87618 Other abnormal cytological findings on specimens from cervix uteri: Secondary | ICD-10-CM

## 2014-07-21 DIAGNOSIS — Z0000 Encounter for general adult medical examination without abnormal findings: Secondary | ICD-10-CM

## 2014-07-21 DIAGNOSIS — Z113 Encounter for screening for infections with a predominantly sexual mode of transmission: Secondary | ICD-10-CM

## 2014-07-21 DIAGNOSIS — Z01419 Encounter for gynecological examination (general) (routine) without abnormal findings: Secondary | ICD-10-CM
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
## 2014-07-21 NOTE — Patient Instructions (Signed)

## 2014-07-22 ENCOUNTER — Other Ambulatory Visit: Payer: Self-pay | Admitting: Certified Nurse Midwife

## 2014-07-22 ENCOUNTER — Telehealth: Payer: Self-pay | Admitting: Nurse Practitioner

## 2014-07-22 DIAGNOSIS — B9689 Other specified bacterial agents as the cause of diseases classified elsewhere: Secondary | ICD-10-CM

## 2014-07-22 DIAGNOSIS — N760 Acute vaginitis: Principal | ICD-10-CM

## 2014-07-22 LAB — STD PANEL
HEP B S AG: NEGATIVE
HIV: NONREACTIVE

## 2014-07-22 LAB — WET PREP BY MOLECULAR PROBE
Candida species: NEGATIVE
Gardnerella vaginalis: POSITIVE — AB
Trichomonas vaginosis: NEGATIVE

## 2014-07-22 MED ORDER — HYLAFEM VA SUPP: 1.0000 | Freq: Every day | VAGINAL | Status: DC

## 2014-07-22 NOTE — Telephone Encounter (Signed)
Pharmacy [facility] but they can only find it under capsul. Ok for call back ask for Erskine SquibbJane [phone]

## 2014-07-22 NOTE — Telephone Encounter (Signed)
[facility]. Advised rx has to be suppository as it is a vaginally inserted medication. Pharmacist states they are unable to order this rx as a suppository. Spoke with patient. [facility] [address] Patient is agreeable.  Routing to provider for final review. Patient agreeable to disposition. Will close encounter.   Patient aware provider will review message and nurse will return call if any additional advice or change of disposition.

## 2014-07-24 NOTE — Progress Notes (Signed)
Encounter reviewed by Dr. Brook Silva.  

## 2014-07-26 LAB — IPS N GONORRHOEA AND CHLAMYDIA BY PCR

## 2014-07-27 LAB — IPS PAP TEST WITH HPV

## 2014-07-28 NOTE — Addendum Note (Signed)
Addended by: Ria Comment R on: 07/28/2014 01:04 PM   Modules accepted: Orders

## 2014-07-30 LAB — IPS HPV GENOTYPING 16/18

## 2014-08-04 ENCOUNTER — Telehealth: Payer: Self-pay | Admitting: Emergency Medicine

## 2014-08-04 NOTE — Telephone Encounter (Signed)
-----   Message from Ria Comment, FNP sent at 08/01/2014  8:53 AM EDT ----- Please let pt. know that pap was normal but positive HR HPV.  The HPV test for # 16 & 18 was negative.  Needs to be sure and recheck in 1 year.  The GC and chlamydia was negative.

## 2014-08-04 NOTE — Telephone Encounter (Signed)
Spoke with patient and message from Provider given. Patient verbalized understanding. 08 recall placed.  Pt states she continues to have vaginal irritation and used 3 days of treatment with Hylafem.  Patient was advised there is refill placed and she can repeat treatment with hylafem as directed for 3 days and to let us know if continued symptoms. Advised likely will need office visit for re-evaluation if symptoms continue after two three day treatments of hylafem. Patient agreeable. Routing to provider for final review. Patient agreeable to disposition. Will close encounter.

## 2015-09-04 ENCOUNTER — Telehealth: Payer: Self-pay | Admitting: *Deleted

## 2015-09-04 NOTE — Telephone Encounter (Signed)
Patient is on 08 recall for 07/2015. Please contact patient and schedule. Thanks Consuella LoseElaine

## 2015-09-07 NOTE — Telephone Encounter (Signed)
Pt clearly aware of need for follow up.  Please send letter and remove from recall.

## 2015-09-07 NOTE — Telephone Encounter (Signed)
Dr. Hyacinth MeekerMiller please see message below regarding 08 recall for this patient. Please advise Thanks Consuella Loselaine

## 2015-09-07 NOTE — Telephone Encounter (Signed)
I spoke with the patient who declines to schedule at this time due to being self-pay.  I stressed the importance of following up due to history of abnormal pap smears.  Patient states she knows she needs to come in but does not have the resources to come in at this time.  Pt also states she is still having yeast/bacterial infections.  Advised we can not treat this over the phone and she would need an office visit to evaluate and treat.  Patient voices understanding, but again states she is not financially able to come in right now.

## 2015-09-11 ENCOUNTER — Encounter: Payer: Self-pay | Admitting: *Deleted

## 2015-09-11 NOTE — Telephone Encounter (Signed)
Letter out -eh 

## 2016-02-19 ENCOUNTER — Ambulatory Visit (INDEPENDENT_AMBULATORY_CARE_PROVIDER_SITE_OTHER): Payer: Self-pay | Admitting: Nurse Practitioner

## 2016-02-19 VITALS — BP 110/80 | HR 100 | Temp 99.3°F | Wt 172.8 lb

## 2016-02-19 DIAGNOSIS — R509 Fever, unspecified: Secondary | ICD-10-CM

## 2016-02-19 DIAGNOSIS — J209 Acute bronchitis, unspecified: Secondary | ICD-10-CM

## 2016-02-19 DIAGNOSIS — J111 Influenza due to unidentified influenza virus with other respiratory manifestations: Secondary | ICD-10-CM

## 2016-02-19 LAB — POCT INFLUENZA A/B
INFLUENZA B, POC: POSITIVE — AB
Influenza A, POC: POSITIVE — AB

## 2016-02-19 MED ORDER — OSELTAMIVIR PHOSPHATE 75 MG PO CAPS: 75.0000 mg | ORAL_CAPSULE | Freq: Two times a day (BID) | ORAL | 0 refills | Status: AC

## 2016-02-19 MED ORDER — BENZONATATE 100 MG PO CAPS: 100.0000 mg | ORAL_CAPSULE | Freq: Two times a day (BID) | ORAL | 0 refills | Status: AC | PRN

## 2016-02-19 NOTE — Patient Instructions (Addendum)
Acute Bronchitis, Adult Acute bronchitis is sudden (acute) swelling of the air tubes (bronchi) in the lungs. Acute bronchitis causes these tubes to fill with mucus, which can make it hard to breathe. It can also cause coughing or wheezing. In adults, acute bronchitis usually goes away within 2 weeks. A cough caused by bronchitis may last up to 3 weeks. Smoking, allergies, and asthma can make the condition worse. Repeated episodes of bronchitis may cause further lung problems, such as chronic obstructive pulmonary disease (COPD). What are the causes? This condition can be caused by germs and by substances that irritate the lungs, including:  Cold and flu viruses. This condition is most often caused by the same virus that causes a cold.  Bacteria.  Exposure to tobacco smoke, dust, fumes, and air pollution. What increases the risk? This condition is more likely to develop in people who:  Have close contact with someone with acute bronchitis.  Are exposed to lung irritants, such as tobacco smoke, dust, fumes, and vapors.  Have a weak immune system.  Have a respiratory condition such as asthma. What are the signs or symptoms? Symptoms of this condition include:  A cough.  Coughing up clear, yellow, or green mucus.  Wheezing.  Chest congestion.  Shortness of breath.  A fever.  Body aches.  Chills.  A sore throat. How is this diagnosed? This condition is usually diagnosed with a physical exam. During the exam, your health care provider may order tests, such as chest X-rays, to rule out other conditions. He or she may also:  Test a sample of your mucus for bacterial infection.  Check the level of oxygen in your blood. This is done to check for pneumonia.  Do a chest X-ray or lung function testing to rule out pneumonia and other conditions.  Perform blood tests. Your health care provider will also ask about your symptoms and medical history. How is this treated? Most cases  of acute bronchitis clear up over time without treatment. Your health care provider may recommend:  Drinking more fluids. Drinking more makes your mucus thinner, which may make it easier to breathe.  Taking a medicine for a fever or cough.  Taking an antibiotic medicine.  Using an inhaler to help improve shortness of breath and to control a cough.  Using a cool mist vaporizer or humidifier to make it easier to breathe. Follow these instructions at home: Medicines  Take over-the-counter and prescription medicines only as told by your health care provider.  If you were prescribed an antibiotic, take it as told by your health care provider. Do not stop taking the antibiotic even if you start to feel better. General instructions  Get plenty of rest.  Drink enough fluids to keep your urine clear or pale yellow.  Avoid smoking and secondhand smoke. Exposure to cigarette smoke or irritating chemicals will make bronchitis worse. If you smoke and you need help quitting, ask your health care provider. Quitting smoking will help your lungs heal faster.  Use an inhaler, cool mist vaporizer, or humidifier as told by your health care provider.  Keep all follow-up visits as told by your health care provider. This is important. How is this prevented? To lower your risk of getting this condition again:  Wash your hands often with soap and water. If soap and water are not available, use hand sanitizer.  Avoid contact with people who have cold symptoms.  Try not to touch your hands to your mouth, nose, or eyes.    Make sure to get the flu shot every year. Contact a health care provider if:  Your symptoms do not improve in 2 weeks of treatment. Get help right away if:  You cough up blood.  You have chest pain.  You have severe shortness of breath.  You become dehydrated.  You faint or keep feeling like you are going to faint.  You keep vomiting.  You have a severe headache.  Your  fever or chills gets worse. This information is not intended to replace advice given to you by your health care provider. Make sure you discuss any questions you have with your health care provider. Document Released: 03/14/2004 Document Revised: 08/30/2015 Document Reviewed: 07/26/2015 Elsevier Interactive Patient Education  2017 [employer] Fever, Adult A fever is an increase in the body's temperature. It is usually defined as a temperature of 100F (38C) or higher. Brief mild or moderate fevers generally have no long-term effects, and they often do not require treatment. Moderate or high fevers may make you feel uncomfortable and can sometimes be a sign of a serious illness or disease. The sweating that may occur with repeated or prolonged fever may also cause dehydration. Fever is confirmed by taking a temperature with a thermometer. A measured temperature can vary with:  Age.  Time of day.  Location of the thermometer:  Mouth (oral).  Rectum (rectal).  Ear (tympanic).  Underarm (axillary).  Forehead (temporal). Follow these instructions at home: Pay attention to any changes in your symptoms. Take these actions to help with your condition:  Take over-the counter and prescription medicines only as told by your health care provider. Follow the dosing instructions carefully.  If you were prescribed an antibiotic medicine, take it as told by your health care provider. Do not stop taking the antibiotic even if you start to feel better.  Rest as needed.  Drink enough fluid to keep your urine clear or pale yellow. This helps to prevent dehydration.  Sponge yourself or bathe with room-temperature water to help reduce your body temperature as needed. Do not use ice water.  Do not overbundle yourself in blankets or heavy clothes.  Take Ibuprofen 800mg  three times daily for fever/pain. Contact a health care provider if:  You vomit.  You cannot eat or drink without  vomiting.  You have diarrhea.  You have pain when you urinate.  Your symptoms do not improve with treatment.  You develop new symptoms.  You develop excessive weakness. Get help right away if:  You have shortness of breath or have trouble breathing.  You are dizzy or you faint.  You are disoriented or confused.  You develop signs of dehydration, such as a dry mouth, decreased urination, or paleness.  You develop severe pain in your abdomen.  You have persistent vomiting or diarrhea.  You develop a skin rash.  Your symptoms suddenly get worse. This information is not intended to replace advice given to you by your health care provider. Make sure you discuss any questions you have with your health care provider. Document Released: 07/31/2000 Document Revised: 07/13/2015 Document Reviewed: 03/31/2014 Elsevier Interactive Patient Education  2017 [employer]  Influenza, Adult Influenza, more commonly known as "the flu," is a viral infection that primarily affects the respiratory tract. The respiratory tract includes organs that help you breathe, such as the lungs, nose, and throat. The flu causes many common cold symptoms, as well as a high fever and body aches. The flu spreads easily from person to person (  is contagious). Getting a flu shot (influenza vaccination) every year is the best way to prevent influenza. What are the causes? Influenza is caused by a virus. You can catch the virus by:  Breathing in droplets from an infected person's cough or sneeze.  Touching something that was recently contaminated with the virus and then touching your mouth, nose, or eyes. What increases the risk? The following factors may make you more likely to get the flu:  Not cleaning your hands frequently with soap and water or alcohol-based hand sanitizer.  Having close contact with many people during cold and flu season.  Touching your mouth, eyes, or nose without washing or sanitizing  your hands first.  Not drinking enough fluids or not eating a healthy diet.  Not getting enough sleep or exercise.  Being under a high amount of stress.  Not getting a yearly (annual) flu shot. You may be at a higher risk of complications from the flu, such as a severe lung infection (pneumonia), if you:  Are over the age of 32.  Are pregnant.  Have a weakened disease-fighting system (immune system). You may have a weakened immune system if you:  Have HIV or AIDS.  Are undergoing chemotherapy.  Aretaking medicines that reduce the activity of (suppress) the immune system.  Have a long-term (chronic) illness, such as heart disease, kidney disease, diabetes, or lung disease.  Have a liver disorder.  Are obese.  Have anemia. What are the signs or symptoms? Symptoms of this condition typically last 4-10 days and may include:  Fever.  Chills.  Headache, body aches, or muscle aches.  Sore throat.  Cough.  Runny or congested nose.  Chest discomfort and cough.  Poor appetite.  Weakness or tiredness (fatigue).  Dizziness.  Nausea or vomiting. How is this diagnosed? This condition may be diagnosed based on your medical history and a physical exam. Your health care provider may do a nose or throat swab test to confirm the diagnosis. How is this treated? If influenza is detected early, you can be treated with antiviral medicine that can reduce the length of your illness and the severity of your symptoms. This medicine may be given by mouth (orally) or through an IV tube that is inserted in one of your veins. The goal of treatment is to relieve symptoms by taking care of yourself at home. This may include taking over-the-counter medicines, drinking plenty of fluids, and adding humidity to the air in your home. In some cases, influenza goes away on its own. Severe influenza or complications from influenza may be treated in a hospital. Follow these instructions at  home:  Take over-the-counter and prescription medicines only as told by your health care provider.  Use a cool mist humidifier to add humidity to the air in your home. This can make breathing easier.  Rest as needed.  Drink enough fluid to keep your urine clear or pale yellow.  Cover your mouth and nose when you cough or sneeze.  Wash your hands with soap and water often, especially after you cough or sneeze. If soap and water are not available, use hand sanitizer.  Stay home from work or school as told by your health care provider. Unless you are visiting your health care provider, try to avoid leaving home until your fever has been gone for 24 hours without the use of medicine.  Keep all follow-up visits as told by your health care provider. This is important. How is this prevented?  Getting  an annual flu shot is the best way to avoid getting the flu. You may get the flu shot in late summer, fall, or winter. Ask your health care provider when you should get your flu shot.  Wash your hands often or use hand sanitizer often.  Avoid contact with people who are sick during cold and flu season.  Eat a healthy diet, drink plenty of fluids, get enough sleep, and exercise regularly. Contact a health care provider if:  You develop new symptoms.  You have:  Chest pain.  Diarrhea.  A fever.  Your cough gets worse.  You produce more mucus.  You feel nauseous or you vomit. Get help right away if:  You develop shortness of breath or difficulty breathing.  Your skin or nails turn a bluish color.  You have severe pain or stiffness in your neck.  You develop a sudden headache or sudden pain in your face or ear.  You cannot stop vomiting. This information is not intended to replace advice given to you by your health care provider. Make sure you discuss any questions you have with your health care provider. Document Released: 02/02/2000 Document Revised: 07/13/2015 Document  Reviewed: 11/29/2014 Elsevier Interactive Patient Education  2017 [employer].

## 2016-02-19 NOTE — Progress Notes (Signed)
Subjective:     Anna OvermanStacy N Thornton is a 31 y.o. female who presents for evaluation of symptoms of a URI.  Patient's mother states symptoms of cough started approximately 3 weeks ago and patient took medications.  Over the last few days patient's URI symptoms returned.   Symptoms include congestion, fever up to 102 and productive cough with  yellow colored sputum. Onset of symptoms was 4 days ago, and has been gradually worsening since that time. Treatment to date: Nyquil, Mucinex, Tylenol and Advil Cold and Sinus.  Patient does not recall any sick contacts.  Denies history of asthma, bronchitis or pneumonia.  The following portions of the patient's history were reviewed and updated as appropriate: current medications, past family history, past medical history, past social history, past surgical history and problem list.  Review of Systems Constitutional: positive for chills, fatigue, fevers and sweats Eyes: negative Ears, nose, mouth, throat, and face: positive for nasal congestion and sore throat Respiratory: negative Cardiovascular: negative Gastrointestinal: positive for vomiting x one episode Neurological: negative Allergic/Immunologic: negative   Objective:    BP 110/80   Pulse 100   Temp 99.3 F (37.4 C) (Oral)   Wt 172 lb 12.8 oz (78.4 kg)   SpO2 97%   BMI 31.35 kg/m  General appearance: alert, cooperative and mild distress Head: Normocephalic, without obvious abnormality, atraumatic, sinuses nontender to percussion Eyes: conjunctivae/corneas clear. PERRL, EOM's intact. Fundi benign. Ears: normal TM's and external ear canals both ears Nose: Nares normal. Septum midline. Mucosa normal. No drainage or sinus tenderness., moderate congestion Throat: lips, mucosa, and tongue normal; teeth and gums normal Lungs: clear to auscultation bilaterally Heart: regular rate and rhythm, S1, S2 normal, no murmur, click, rub or gallop Abdomen: soft, non-tender; bowel sounds normal; no masses,  no  organomegaly Skin: Skin color, texture, turgor normal. No rashes or lesions Neurologic: Alert and oriented X 3, normal strength and tone. Normal symmetric reflexes. Normal coordination and gait   Assessment:    bronchitis and influenza   Plan:    Discussed diagnosis and treatment of URI. Suggested symptomatic OTC remedies. Nasal saline spray for congestion. Follow up as needed.

## 2016-02-21 ENCOUNTER — Telehealth: Payer: Self-pay | Admitting: Nurse Practitioner

## 2016-02-21 NOTE — Telephone Encounter (Signed)
Called patient to check her status.  Reached voicemail.  Left message for patient to return my call.

## 2017-07-07 MED FILL — predniSONE 10 MG TABS: 10 | 6 days supply | Qty: 21 | Fill #0

## 2018-09-23 ENCOUNTER — Other Ambulatory Visit (HOSPITAL_COMMUNITY)
Admission: RE | Admit: 2018-09-23 | Discharge: 2018-09-23 | Disposition: A | Discharge status: Home / Self Care | Payer: 59 | Source: Ambulatory Visit | Attending: Family | Admitting: Family

## 2018-09-23 ENCOUNTER — Other Ambulatory Visit (INDEPENDENT_AMBULATORY_CARE_PROVIDER_SITE_OTHER): Payer: 59

## 2018-09-23 ENCOUNTER — Other Ambulatory Visit: Payer: Self-pay

## 2018-09-23 ENCOUNTER — Ambulatory Visit (INDEPENDENT_AMBULATORY_CARE_PROVIDER_SITE_OTHER): Payer: 59 | Admitting: Family

## 2018-09-23 ENCOUNTER — Encounter: Payer: Self-pay | Admitting: Family

## 2018-09-23 VITALS — BP 122/84 | HR 78 | Temp 98.4°F | Ht 62.25 in | Wt 167.8 lb

## 2018-09-23 DIAGNOSIS — N898 Other specified noninflammatory disorders of vagina: Secondary | ICD-10-CM

## 2018-09-23 DIAGNOSIS — Z124 Encounter for screening for malignant neoplasm of cervix: Secondary | ICD-10-CM

## 2018-09-23 DIAGNOSIS — Z113 Encounter for screening for infections with a predominantly sexual mode of transmission: Secondary | ICD-10-CM

## 2018-09-23 DIAGNOSIS — Z23 Encounter for immunization: Secondary | ICD-10-CM

## 2018-09-23 LAB — CBC WITH DIFFERENTIAL/PLATELET
Basophils Absolute: 0.1 10*3/uL (ref 0.0–0.1)
Basophils Relative: 1 % (ref 0.0–3.0)
Eosinophils Absolute: 0.1 10*3/uL (ref 0.0–0.7)
Eosinophils Relative: 1.5 % (ref 0.0–5.0)
HCT: 40.9 % (ref 36.0–46.0)
Hemoglobin: 13.8 g/dL (ref 12.0–15.0)
Lymphocytes Relative: 37.4 % (ref 12.0–46.0)
Lymphs Abs: 2.3 10*3/uL (ref 0.7–4.0)
MCHC: 33.6 g/dL (ref 30.0–36.0)
MCV: 86.4 fl (ref 78.0–100.0)
Monocytes Absolute: 0.4 10*3/uL (ref 0.1–1.0)
Monocytes Relative: 6.7 % (ref 3.0–12.0)
Neutro Abs: 3.3 10*3/uL (ref 1.4–7.7)
Neutrophils Relative %: 53.4 % (ref 43.0–77.0)
Platelets: 316 10*3/uL (ref 150.0–400.0)
RBC: 4.74 Mil/uL (ref 3.87–5.11)
RDW: 13 % (ref 11.5–15.5)
WBC: 6.2 10*3/uL (ref 4.0–10.5)

## 2018-09-23 LAB — COMPREHENSIVE METABOLIC PANEL
ALT: 12 U/L (ref 0–35)
AST: 32 U/L (ref 0–37)
Albumin: 4.7 g/dL (ref 3.5–5.2)
Alkaline Phosphatase: 107 U/L (ref 39–117)
BUN: 13 mg/dL (ref 6–23)
CO2: 29 mEq/L (ref 19–32)
Calcium: 10 mg/dL (ref 8.4–10.5)
Chloride: 102 mEq/L (ref 96–112)
Creatinine, Ser: 0.82 mg/dL (ref 0.40–1.20)
GFR: 79.99 mL/min (ref >60.00)
Glucose, Bld: 107 mg/dL — ABNORMAL HIGH (ref 70–99)
Potassium: 4.2 mEq/L (ref 3.5–5.1)
Sodium: 138 mEq/L (ref 135–145)
Total Bilirubin: 0.3 mg/dL (ref 0.2–1.2)
Total Protein: 7.5 g/dL (ref 6.0–8.3)
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
## 2018-09-23 NOTE — Patient Instructions (Signed)
Bacterial Vaginosis  Bacterial vaginosis is a vaginal infection that occurs when the normal balance of bacteria in the vagina is disrupted. It results from an overgrowth of certain bacteria. This is the most common vaginal infection among women ages 15-44. Because bacterial vaginosis increases your risk for STIs (sexually transmitted infections), getting treated can help reduce your risk for chlamydia, gonorrhea, herpes, and HIV (human immunodeficiency virus). Treatment is also important for preventing complications in pregnant women, because this condition can cause an early (premature) delivery. What are the causes? This condition is caused by an increase in harmful bacteria that are normally present in small amounts in the vagina. However, the reason that the condition develops is not fully understood. What increases the risk? The following factors may make you more likely to develop this condition:  Having a new sexual partner or multiple sexual partners.  Having unprotected sex.  Douching.  Having an intrauterine device (IUD).  Smoking.  Drug and alcohol abuse.  Taking certain antibiotic medicines.  Being pregnant. You cannot get bacterial vaginosis from toilet seats, bedding, swimming pools, or contact with objects around you. What are the signs or symptoms? Symptoms of this condition include:  Grey or white vaginal discharge. The discharge can also be watery or foamy.  A fish-like odor with discharge, especially after sexual intercourse or during menstruation.  Itching in and around the vagina.  Burning or pain with urination. Some women with bacterial vaginosis have no signs or symptoms. How is this diagnosed? This condition is diagnosed based on:  Your medical history.  A physical exam of the vagina.  Testing a sample of vaginal fluid under a microscope to look for a large amount of bad bacteria or abnormal cells. Your health care provider may use a cotton swab or  a small wooden spatula to collect the sample. How is this treated? This condition is treated with antibiotics. These may be given as a pill, a vaginal cream, or a medicine that is put into the vagina (suppository). If the condition comes back after treatment, a second round of antibiotics may be needed. Follow these instructions at home: Medicines  Take over-the-counter and prescription medicines only as told by your health care provider.  Take or use your antibiotic as told by your health care provider. Do not stop taking or using the antibiotic even if you start to feel better. General instructions  If you have a female sexual partner, tell her that you have a vaginal infection. She should see her health care provider and be treated if she has symptoms. If you have a female sexual partner, he does not need treatment.  During treatment: ? Avoid sexual activity until you finish treatment. ? Do not douche. ? Avoid alcohol as directed by your health care provider. ? Avoid breastfeeding as directed by your health care provider.  Drink enough water and fluids to keep your urine clear or pale yellow.  Keep the area around your vagina and rectum clean. ? Wash the area daily with warm water. ? Wipe yourself from front to back after using the toilet.  Keep all follow-up visits as told by your health care provider. This is important. How is this prevented?  Do not douche.  Wash the outside of your vagina with warm water only.  Use protection when having sex. This includes latex condoms and dental dams.  Limit how many sexual partners you have. To help prevent bacterial vaginosis, it is best to have sex with just one partner (  monogamous).  Make sure you and your sexual partner are tested for STIs.  Wear cotton or cotton-lined underwear.  Avoid wearing tight pants and pantyhose, especially during summer.  Limit the amount of alcohol that you drink.  Do not use any products that contain  nicotine or tobacco, such as cigarettes and e-cigarettes. If you need help quitting, ask your health care provider.  Do not use illegal drugs. Where to find more information  Centers for Disease Control and Prevention: www.cdc.gov/std  American Sexual Health Association (ASHA): www.ashastd.org  [place] Department of Health and Human Services, Office on Women's Health: www.womenshealth.gov/ or https://www.womenshealth.gov/a-z-topics/bacterial-vaginosis Contact a health care provider if:  Your symptoms do not improve, even after treatment.  You have more discharge or pain when urinating.  You have a fever.  You have pain in your abdomen.  You have pain during sex.  You have vaginal bleeding between periods. Summary  Bacterial vaginosis is a vaginal infection that occurs when the normal balance of bacteria in the vagina is disrupted.  Because bacterial vaginosis increases your risk for STIs (sexually transmitted infections), getting treated can help reduce your risk for chlamydia, gonorrhea, herpes, and HIV (human immunodeficiency virus). Treatment is also important for preventing complications in pregnant women, because the condition can cause an early (premature) delivery.  This condition is treated with antibiotic medicines. These may be given as a pill, a vaginal cream, or a medicine that is put into the vagina (suppository). This information is not intended to replace advice given to you by your health care provider. Make sure you discuss any questions you have with your health care provider. Document Released: 02/04/2005 Document Revised: 01/17/2017 Document Reviewed: 10/21/2015 Elsevier Patient Education  2020 [employer] Inc[employer]  

## 2018-09-24 LAB — RPR: RPR Ser Ql: NONREACTIVE

## 2018-09-24 LAB — HIV ANTIBODY (ROUTINE TESTING W REFLEX): HIV 1&2 Ab, 4th Generation: NONREACTIVE

## 2018-09-25 LAB — CYTOLOGY - PAP
Chlamydia: NEGATIVE
Diagnosis: NEGATIVE
Neisseria Gonorrhea: NEGATIVE
Trichomonas: NEGATIVE

## 2018-09-28 ENCOUNTER — Other Ambulatory Visit: Payer: Self-pay | Admitting: Family

## 2018-09-28 DIAGNOSIS — N889 Noninflammatory disorder of cervix uteri, unspecified: Secondary | ICD-10-CM

## 2018-10-30 ENCOUNTER — Other Ambulatory Visit: Payer: Self-pay

## 2018-11-02 ENCOUNTER — Ambulatory Visit: Payer: 59 | Admitting: Obstetrics & Gynecology

## 2018-11-02 ENCOUNTER — Other Ambulatory Visit: Payer: Self-pay

## 2018-11-02 ENCOUNTER — Encounter: Payer: Self-pay | Admitting: Obstetrics & Gynecology

## 2018-11-02 VITALS — BP 122/78 | Ht 62.0 in | Wt 167.0 lb

## 2018-11-02 DIAGNOSIS — R87810 Cervical high risk human papillomavirus (HPV) DNA test positive: Secondary | ICD-10-CM

## 2018-11-02 DIAGNOSIS — R87610 Atypical squamous cells of undetermined significance on cytologic smear of cervix (ASC-US): Secondary | ICD-10-CM | POA: Diagnosis not present

## 2018-11-02 NOTE — Progress Notes (Signed)
Anna Thornton [birth year] [id number]   History:    33 y.o. G0  RP:  New patient presenting for Colposcopy  HPI: Pap 09/23/2018 Negative, but HPV HR not done.  Fam NP's impression on speculum exam was that abnormal cells were possibly present on the cervix.  Gono-Chlam-Trich all negative.  H/O HPV HR positive on Pap in 2016, Pap was negative and HPV 16-18-45 were negative.    Past medical history,surgical history, family history and social history were all reviewed and documented in the EPIC chart.  Gynecologic History Patient's last menstrual period was 10/16/2018. Contraception: condoms Last Pap: 09/23/2018. Results were: Negative.  HPV HR not done.   Obstetric History OB History  Gravida Para Term Preterm AB Living  0            SAB TAB Ectopic Multiple Live Births                ROS: A ROS was performed and pertinent positives and negatives are included in the history.  GENERAL: No fevers or chills. HEENT: No change in vision, no earache, sore throat or sinus congestion. NECK: No pain or stiffness. CARDIOVASCULAR: No chest pain or pressure. No palpitations. PULMONARY: No shortness of breath, cough or wheeze. GASTROINTESTINAL: No abdominal pain, nausea, vomiting or diarrhea, melena or bright red blood per rectum. GENITOURINARY: No urinary frequency, urgency, hesitancy or dysuria. MUSCULOSKELETAL: No joint or muscle pain, no back pain, no recent trauma. DERMATOLOGIC: No rash, no itching, no lesions. ENDOCRINE: No polyuria, polydipsia, no heat or cold intolerance. No recent change in weight. HEMATOLOGICAL: No anemia or easy bruising or bleeding. NEUROLOGIC: No headache, seizures, numbness, tingling or weakness. PSYCHIATRIC: No depression, no loss of interest in normal activity or change in sleep pattern.     Exam:   BP 122/78   Ht 5\' 2"  (1.575 m)   Wt 167 lb (75.8 kg)   LMP 10/16/2018   BMI 30.54 kg/m   Body mass index is 30.54 kg/m.  General appearance : Well developed  well nourished female. No acute distress  Colposcopy Procedure Note ERNEST ORR 11/02/2018  Indications:  Abnormal cervix on Annual Exam with Fam MD/Pap test negative, but HPV HR not done.  H/O HR HPV positive in 2016  Procedure Details  The risks and benefits of the procedure and Verbal informed consent obtained.  Speculum placed in vagina and excellent visualization of cervix achieved, cervix swabbed x 3 with acetic acid solution.  Findings:  Cervix colposcopy: Physical Exam Genitourinary:       Vaginal colposcopy: Normal  Vulvar colposcopy: Normal  Perirectal colposcopy: Grossly normal  The cervix was sprayed with Hurricane before performing the cervical biopsies  Specimen:  Pap/HPV HR.  Cervical biopsy at 4 O'Clock.    Complications:  None, hemostasis with Silver Nitrate . Plan:  Management per results   Assessment/Plan:  33 y.o. female for annual exam   1. Cervical high risk HPV (human papillomavirus) test positive Cervical high risk HPV positive in 2016.  Recent Pap test negative, but high risk HPV not done.  Impression of an abnormal cervix per family physician at annual exam.  Colposcopy procedure reviewed with patient.  Colposcopy findings reviewed.  Pap test with high-risk HPV done.  Cervical biopsy done at 4:00.  Management per results.  Precautions post colposcopy discussed.  Other orders - Pathology Report ([facility])  Counseling on above issues and coordination of care more than 50% for 30 minutes.  Princess Bruins MD, 12:11 PM  11/02/2018   

## 2018-11-08 ENCOUNTER — Encounter: Payer: Self-pay | Admitting: Obstetrics & Gynecology

## 2018-11-08 NOTE — Patient Instructions (Signed)
1. Cervical high risk HPV (human papillomavirus) test positive Cervical high risk HPV positive in 2016.  Recent Pap test negative, but high risk HPV not done.  Impression of an abnormal cervix per family physician at annual exam.  Colposcopy procedure reviewed with patient.  Colposcopy findings reviewed.  Pap test with high-risk HPV done.  Cervical biopsy done at 4:00.  Management per results.  Precautions post colposcopy discussed.  Other orders - Pathology Report ([facility])  Anna Thornton, it was a pleasure meeting you today!  I will inform you of your results as soon as they are available.

## 2018-11-10 LAB — TISSUE SPECIMEN

## 2018-11-10 LAB — PATHOLOGY REPORT

## 2018-11-12 LAB — PAP, TP IMAGING W/ HPV RNA, RFLX HPV TYPE 16,18/45: HPV DNA High Risk: DETECTED — AB

## 2018-11-12 LAB — HPV TYPE 16 AND 18/45 RNA
HPV Type 16 RNA: NOT DETECTED
HPV Type 18/45 RNA: NOT DETECTED

## 2019-05-05 ENCOUNTER — Other Ambulatory Visit: Payer: Self-pay

## 2019-05-06 ENCOUNTER — Encounter: Payer: Self-pay | Admitting: Obstetrics & Gynecology

## 2019-05-06 ENCOUNTER — Ambulatory Visit: Payer: 59 | Admitting: Obstetrics & Gynecology

## 2019-05-06 VITALS — BP 134/82

## 2019-05-06 DIAGNOSIS — R87610 Atypical squamous cells of undetermined significance on cytologic smear of cervix (ASC-US): Secondary | ICD-10-CM | POA: Diagnosis not present

## 2019-05-06 DIAGNOSIS — R87810 Cervical high risk human papillomavirus (HPV) DNA test positive: Secondary | ICD-10-CM | POA: Diagnosis not present

## 2019-05-06 DIAGNOSIS — N898 Other specified noninflammatory disorders of vagina: Secondary | ICD-10-CM

## 2019-05-06 DIAGNOSIS — N760 Acute vaginitis: Secondary | ICD-10-CM

## 2019-05-06 DIAGNOSIS — B9689 Other specified bacterial agents as the cause of diseases classified elsewhere: Secondary | ICD-10-CM

## 2019-05-06 LAB — WET PREP FOR TRICH, YEAST, CLUE

## 2019-05-06 MED ORDER — TINIDAZOLE 500 MG PO TABS: 1.0000 g | ORAL_TABLET | Freq: Two times a day (BID) | ORAL | 0 refills | Status: DC

## 2019-05-06 MED ORDER — TINIDAZOLE 500 MG PO TABS: 1.0000 g | ORAL_TABLET | Freq: Two times a day (BID) | ORAL | 3 refills | Status: AC

## 2019-05-06 MED FILL — TINIDAZOLE 500 MG TAB: 500 | 2 days supply | Qty: 8 | Fill #0

## 2019-05-06 NOTE — Progress Notes (Signed)
    Anna Thornton [birth year] [id number]        35 y.o.  G0  RP: Repeat 6 month Pap test  HPI: Colpo 10/2018 ASCUS/HPV HR pos.  HPV 16-18-45 Negative.  Cervical Bx no dysplasia.  Frequent BVs.  Vaginal discharge with odor.  Noticed allergic reaction to latex containing items.  Wondering if the condoms may be causing her some of her symptoms.   OB History  Gravida Para Term Preterm AB Living  0            SAB TAB Ectopic Multiple Live Births               Past medical history,surgical history, problem list, medications, allergies, family history and social history were all reviewed and documented in the EPIC chart.   Directed ROS with pertinent positives and negatives documented in the history of present illness/assessment and plan.  Exam:  Vitals:   05/06/19 1046  BP: 134/82   General appearance:  Normal  Abdomen: Normal  Gynecologic exam: Vulva normal.  Speculum: Cervix and vagina normal.  Pap test with high-risk HPV done.  Mild increase in vaginal discharge.  Wet prep done.  Wet prep: Clue cells present.   Assessment/Plan:  34 y.o. G0P0   1. ASCUS with positive high risk HPV cervical Colpo 10/2018 ASCUS/HPV HR pos.  HPV 16-18-45 Negative.  Cervical Bx no dysplasia.  Repeat Pap test today.  Management per results. - PAP,TP IMGw/HPV RNA,rflx HPVTYPE16,18/45  2. Vaginal odor Bacterial vaginosis confirmed by wet prep.  Precautions reviewed as patient has recurrences.  Will treat with tinidazole 2 tablets twice a day for 2 days.  Usage reviewed and prescription sent to pharmacy. - WET PREP FOR TRICH, YEAST, CLUE  Other orders - tinidazole (TINDAMAX) 500 MG tablet; Take 2 tablets (1,000 mg total) by mouth 2 (two) times daily for 2 days.  [name] MD, 11:04 AM 05/06/2019

## 2019-05-14 ENCOUNTER — Encounter: Payer: Self-pay | Admitting: Obstetrics & Gynecology

## 2019-05-14 NOTE — Patient Instructions (Signed)
1. ASCUS with positive high risk HPV cervical Colpo 10/2018 ASCUS/HPV HR pos.  HPV 16-18-45 Negative.  Cervical Bx no dysplasia.  Repeat Pap test today.  Management per results. - PAP,TP IMGw/HPV RNA,rflx HPVTYPE16,18/45  2. Vaginal odor Bacterial vaginosis confirmed by wet prep.  Precautions reviewed as patient has recurrences.  Will treat with tinidazole 2 tablets twice a day for 2 days.  Usage reviewed and prescription sent to pharmacy. - WET PREP FOR TRICH, YEAST, CLUE  Other orders - tinidazole (TINDAMAX) 500 MG tablet; Take 2 tablets (1,000 mg total) by mouth 2 (two) times daily for 2 days.  Anna Thornton, it was a pleasure seeing you today!

## 2019-05-26 LAB — PAP, TP IMAGING W/ HPV RNA, RFLX HPV TYPE 16,18/45: HPV DNA High Risk: DETECTED — AB

## 2019-05-26 LAB — HPV TYPE 16 AND 18/45 RNA
HPV Type 16 RNA: NOT DETECTED
HPV Type 18/45 RNA: NOT DETECTED

## 2019-06-04 MED FILL — TINIDAZOLE 500 MG TAB: 500 | 2 days supply | Qty: 8 | Fill #1

## 2019-07-05 MED FILL — TINIDAZOLE 500 MG TAB: 500 | 2 days supply | Qty: 8 | Fill #2

## 2019-08-12 MED FILL — TINIDAZOLE 500 MG TAB: 500 | 2 days supply | Qty: 8 | Fill #3

## 2019-11-09 ENCOUNTER — Ambulatory Visit: Payer: 59 | Admitting: Obstetrics & Gynecology

## 2019-11-09 ENCOUNTER — Encounter: Payer: Self-pay | Admitting: Obstetrics & Gynecology

## 2019-11-09 ENCOUNTER — Other Ambulatory Visit: Payer: Self-pay

## 2019-11-09 VITALS — BP 120/78 | Ht 62.0 in | Wt 168.0 lb

## 2019-11-09 DIAGNOSIS — R87810 Cervical high risk human papillomavirus (HPV) DNA test positive: Secondary | ICD-10-CM

## 2019-11-09 DIAGNOSIS — Z01419 Encounter for gynecological examination (general) (routine) without abnormal findings: Secondary | ICD-10-CM

## 2019-11-09 DIAGNOSIS — E6609 Other obesity due to excess calories: Secondary | ICD-10-CM | POA: Diagnosis not present

## 2019-11-09 DIAGNOSIS — Z6830 Body mass index (BMI) 30.0-30.9, adult: Secondary | ICD-10-CM

## 2019-11-09 DIAGNOSIS — Z789 Other specified health status: Secondary | ICD-10-CM

## 2019-11-09 NOTE — Addendum Note (Signed)
Addended by: Tito Dine on: 11/09/2019 03:21 PM   Modules accepted: Orders

## 2019-11-09 NOTE — Progress Notes (Signed)
Anna Thornton March 02, 1985 [id number]   History:    34 y.o. G0  Stable boyfriend  RP:  New patient presenting for Colposcopy  HPI: Menses regular normal every month.  No BTB.  No pelvic pain.  Using non-latex condoms. Pap 04/2019 Negative/HPV HR pos.  Colpo 10/2018 for ASCUS/HPV HR pos.  HPV 16-18-45 Negative. Cervical Bx no dysplasia.  Breasts normal.  Urine/BMs normal.  BMI 30.73.  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  Healthy nutrition.   Past medical history,surgical history, family history and social history were all reviewed and documented in the EPIC chart.  Gynecologic History Patient's last menstrual period was 10/26/2019.  Obstetric History OB History  Gravida Para Term Preterm AB Living  0 0 0 0 0 0  SAB TAB Ectopic Multiple Live Births  0 0 0 0 0     ROS: A ROS was performed and pertinent positives and negatives are included in the history.  GENERAL: No fevers or chills. HEENT: No change in vision, no earache, sore throat or sinus congestion. NECK: No pain or stiffness. CARDIOVASCULAR: No chest pain or pressure. No palpitations. PULMONARY: No shortness of breath, cough or wheeze. GASTROINTESTINAL: No abdominal pain, nausea, vomiting or diarrhea, melena or bright red blood per rectum. GENITOURINARY: No urinary frequency, urgency, hesitancy or dysuria. MUSCULOSKELETAL: No joint or muscle pain, no back pain, no recent trauma. DERMATOLOGIC: No rash, no itching, no lesions. ENDOCRINE: No polyuria, polydipsia, no heat or cold intolerance. No recent change in weight. HEMATOLOGICAL: No anemia or easy bruising or bleeding. NEUROLOGIC: No headache, seizures, numbness, tingling or weakness. PSYCHIATRIC: No depression, no loss of interest in normal activity or change in sleep pattern.     Exam:   BP 120/78 (BP Location: Right Arm, Patient Position: Sitting, Cuff Size: Normal)   Ht 5\' 2"  (1.575 m)   Wt 168 lb (76.2 kg)   LMP 10/26/2019   BMI  30.73 kg/m   Body mass index is 30.73 kg/m.  General appearance : Well developed well nourished female. No acute distress HEENT: Eyes: no retinal hemorrhage or exudates,  Neck supple, trachea midline, no carotid bruits, no thyroidmegaly Lungs: Clear to auscultation, no rhonchi or wheezes, or rib retractions  Heart: Regular rate and rhythm, no murmurs or gallops Breast:Examined in sitting and supine position were symmetrical in appearance, no palpable masses or tenderness,  no skin retraction, no nipple inversion, no nipple discharge, no skin discoloration, no axillary or supraclavicular lymphadenopathy Abdomen: no palpable masses or tenderness, no rebound or guarding Extremities: no edema or skin discoloration or tenderness  Pelvic: Vulva: Normal             Vagina: No gross lesions or discharge  Cervix: No gross lesions or discharge.  Pap reflex done.  Uterus  AV, normal size, shape and consistency, non-tender and mobile  Adnexa  Without masses or tenderness  Anus: Normal   Assessment/Plan:  34 y.o. female for annual exam   1. Encounter for routine gynecological examination with Papanicolaou smear of cervix Normal gynecologic exam.  Pap reflex today.  Breast exam normal.  2. Use of condoms for contraception  3. Cervical high risk HPV (human papillomavirus) test positive Pap reflex done today.  Colposcopy September 2020 showed no dysplasia on biopsy of the cervix.  HPV 16-18-45 were all negative.    4. Class 1 obesity due to excess calories without serious comorbidity with body mass index (BMI) of 30.0 to 30.9  in adult Continue with a low calorie/carb diet.  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  [name] MD, 2:59 PM 11/09/2019

## 2019-11-12 LAB — PAP IG W/ RFLX HPV ASCU

## 2019-11-12 LAB — HUMAN PAPILLOMAVIRUS, HIGH RISK: HPV DNA High Risk: DETECTED — AB

## 2019-11-19 DIAGNOSIS — Z87410 Personal history of cervical dysplasia: Secondary | ICD-10-CM

## 2019-11-19 HISTORY — DX: Personal history of cervical dysplasia: Z87.410

## 2019-12-08 ENCOUNTER — Ambulatory Visit: Payer: 59 | Admitting: Obstetrics & Gynecology

## 2019-12-08 ENCOUNTER — Encounter: Payer: Self-pay | Admitting: Obstetrics & Gynecology

## 2019-12-08 ENCOUNTER — Other Ambulatory Visit: Payer: Self-pay

## 2019-12-08 VITALS — BP 130/86

## 2019-12-08 DIAGNOSIS — N871 Moderate cervical dysplasia: Secondary | ICD-10-CM | POA: Diagnosis not present

## 2019-12-08 DIAGNOSIS — R87610 Atypical squamous cells of undetermined significance on cytologic smear of cervix (ASC-US): Secondary | ICD-10-CM

## 2019-12-08 DIAGNOSIS — R87810 Cervical high risk human papillomavirus (HPV) DNA test positive: Secondary | ICD-10-CM

## 2019-12-08 NOTE — Progress Notes (Signed)
    Anna Thornton January 20, 1986 [id number]        34 y.o.  G0P0000   RP: ASCUS/HPV HR Pos for Colpo  HPI: ASCUS/HPV HR pos on Pap 11/09/2019.  Colpo 10/2018 No dysplasia, HPV 16-18-45 Neg.   OB History  Gravida Para Term Preterm AB Living  0 0 0 0 0 0  SAB TAB Ectopic Multiple Live Births  0 0 0 0 0    Past medical history,surgical history, problem list, medications, allergies, family history and social history were all reviewed and documented in the EPIC chart.   Directed ROS with pertinent positives and negatives documented in the history of present illness/assessment and plan.  Exam:  Vitals:   12/08/19 1156  BP: 130/86   General appearance:  Normal  Colposcopy Procedure Note Anna Thornton 12/08/2019  Indications: ASCUS/HPV HR pos  Procedure Details  The risks and benefits of the procedure and Verbal informed consent obtained.  Speculum placed in vagina and excellent visualization of cervix achieved, cervix swabbed x 3 with acetic acid solution.  Findings:  Cervix colposcopy: Physical Exam Genitourinary:       Vaginal colposcopy: Normal  Vulvar colposcopy: Normal  Perirectal colposcopy: Normal  The cervix was sprayed with Hurricane before performing the cervical biopsies.  Specimens: Cervical Bxs at 1 and 6 O'Clock  Complications: None, good hemostasis with Silver Nitrate . Plan:  Management per results   Assessment/Plan:  34 y.o. G0P0000   1. ASCUS with positive high risk HPV cervical ASCUS with positive high risk HPV on Pap test Nov 09, 2019. Colposcopy Sep 2020 showed no dysplasia and HPV 16-18-45 were negative. Colposcopy procedure explained and findings reviewed with patient. Postprocedure precautions discussed. Management per results. - Pathology Report ([facility])  Anna Del MD, 12:24 PM 12/08/2019

## 2019-12-13 LAB — TISSUE PATH REPORT

## 2019-12-13 LAB — PATHOLOGY REPORT

## 2019-12-21 ENCOUNTER — Ambulatory Visit: Payer: 59 | Admitting: Obstetrics & Gynecology

## 2019-12-21 ENCOUNTER — Encounter: Payer: Self-pay | Admitting: Obstetrics & Gynecology

## 2019-12-21 ENCOUNTER — Other Ambulatory Visit: Payer: Self-pay

## 2019-12-21 VITALS — BP 130/82

## 2019-12-21 DIAGNOSIS — N871 Moderate cervical dysplasia: Secondary | ICD-10-CM | POA: Diagnosis not present

## 2019-12-21 NOTE — Progress Notes (Signed)
    Anna Thornton June 09, 1985 [id number]        34 y.o.  G0 Long term boyfriend  RP: CIN 2 for LEEP procedure  HPI:  ASCUS/HPV HR pos.  HPV 16-18-45 Neg.  Colpo 12/08/2019 CIN 2.  Would like to conceive in the future, but boyfriend has grown up children and doesn't want more.  Patient uncertain at this time on what decision she will make with regards to having children.   OB History  Gravida Para Term Preterm AB Living  0 0 0 0 0 0  SAB TAB Ectopic Multiple Live Births  0 0 0 0 0    Past medical history,surgical history, problem list, medications, allergies, family history and social history were all reviewed and documented in the EPIC chart.   Directed ROS with pertinent positives and negatives documented in the history of present illness/assessment and plan.  Exam:  Vitals:   12/21/19 1513  BP: 130/82   General appearance:  Normal  Patho cervical Bx 12/08/2019 CIN 2   Assessment/Plan:  34 y.o. G0  1. Dysplasia of cervix, high grade CIN 2 Counseling on High Grade (CIN 2) Dysplasia and HR HPV.  Management with repeat Colpo at 3 months vs LEEP procedure thoroughly reviewed.  Risks of progression to a higher grade lesion, including Cervical Cancer vs risks associated with the LEEP procedure including Infertility and Cervical Incompetence reviewed.  F/U repeat Colpo in 03/2020.  Patient voiced understanding and agreement with plan.  Anna Del MD, 3:18 PM 12/21/2019

## 2019-12-25 ENCOUNTER — Encounter: Payer: Self-pay | Admitting: Obstetrics & Gynecology

## 2020-03-23 ENCOUNTER — Ambulatory Visit: Payer: 59 | Admitting: Obstetrics & Gynecology

## 2020-03-23 ENCOUNTER — Encounter: Payer: Self-pay | Admitting: Obstetrics & Gynecology

## 2020-03-23 ENCOUNTER — Other Ambulatory Visit: Payer: Self-pay

## 2020-03-23 VITALS — BP 130/84 | HR 80 | Wt 170.6 lb

## 2020-03-23 DIAGNOSIS — N871 Moderate cervical dysplasia: Secondary | ICD-10-CM | POA: Diagnosis not present

## 2020-03-23 DIAGNOSIS — R87810 Cervical high risk human papillomavirus (HPV) DNA test positive: Secondary | ICD-10-CM

## 2020-03-23 DIAGNOSIS — R87610 Atypical squamous cells of undetermined significance on cytologic smear of cervix (ASC-US): Secondary | ICD-10-CM

## 2020-03-23 NOTE — Progress Notes (Signed)
    Anna Thornton 08-11-1985 [id number]        35 y.o.  G0  Long term boyfriend  RP: CIN 2 for repeat Colposcopy  HPI: ASCUS/HPV HR pos.  HPV 16-18-45 Neg.  Colpo 12/08/2019 CIN 2.  Decision to observe rather than perform a LEEP and repeat a Colpo today.   OB History  Gravida Para Term Preterm AB Living  0 0 0 0 0 0  SAB IAB Ectopic Multiple Live Births  0 0 0 0 0    Past medical history,surgical history, problem list, medications, allergies, family history and social history were all reviewed and documented in the EPIC chart.   Directed ROS with pertinent positives and negatives documented in the history of present illness/assessment and plan.  Exam:  Vitals:   03/23/20 1127  BP: 130/84  Pulse: 80  Weight: 170 lb 9.6 oz (77.4 kg)   General appearance:  Normal  Colposcopy Procedure Note Anna Thornton 03/23/2020  Indications: ASCUS/HPV HR pos/Colpo 12/08/2019 CIN 2 (No LEEP done)  Procedure Details  The risks and benefits of the procedure and Verbal informed consent obtained.  Speculum placed in vagina and excellent visualization of cervix achieved, cervix swabbed x 3 with acetic acid solution.  Findings:  Cervix colposcopy: Physical Exam Genitourinary:       Vaginal colposcopy: Normal  Vulvar colposcopy: Normal  Perirectal colposcopy: Normal  The cervix was sprayed with Hurricane before performing the cervical biopsies.  Specimens:  Pap/HPV HR.  Cervix Bx at 3, 6 and 10 O'Clock.  Complications:  None, good hemostasis with Silver Nitrate. . Plan:  Management per results   Assessment/Plan:  35 y.o. G0  1. Dysplasia of cervix, high grade CIN 2 ASCUS/HPV HR pos.  HPV 16-18-45 Neg.  Colpo 12/08/2019 CIN 2.  Decision to observe rather than a LEEP.  Colposcopy findings today reviewed with patient.  Management per Cervical Bx/Pap/HPV HR results.  Post procedure precautions.  2. ASCUS with positive high risk HPV cervical Pap/HPV HR done  today.  Anna Del MD, 11:40 AM 03/23/2020

## 2020-03-23 NOTE — Addendum Note (Signed)
Addended by: Tito Dine on: 03/23/2020 12:18 PM   Modules accepted: Orders

## 2020-03-27 LAB — TISSUE PATH REPORT 10802

## 2020-03-27 LAB — PATHOLOGY REPORT

## 2020-03-27 LAB — PAP, TP IMAGING W/ HPV RNA, RFLX HPV TYPE 16,18/45: HPV DNA High Risk: DETECTED — AB

## 2020-11-16 ENCOUNTER — Other Ambulatory Visit (HOSPITAL_COMMUNITY)
Admission: RE | Admit: 2020-11-16 | Discharge: 2020-11-16 | Disposition: A | Discharge status: Home / Self Care | Payer: 59 | Source: Ambulatory Visit | Attending: Obstetrics & Gynecology | Admitting: Obstetrics & Gynecology

## 2020-11-16 ENCOUNTER — Other Ambulatory Visit: Payer: Self-pay

## 2020-11-16 ENCOUNTER — Encounter: Payer: Self-pay | Admitting: Obstetrics & Gynecology

## 2020-11-16 ENCOUNTER — Ambulatory Visit (INDEPENDENT_AMBULATORY_CARE_PROVIDER_SITE_OTHER): Payer: 59 | Admitting: Obstetrics & Gynecology

## 2020-11-16 VITALS — BP 120/76 | HR 74 | Resp 16 | Ht 62.75 in | Wt 168.0 lb

## 2020-11-16 DIAGNOSIS — Z01419 Encounter for gynecological examination (general) (routine) without abnormal findings: Secondary | ICD-10-CM | POA: Diagnosis present

## 2020-11-16 DIAGNOSIS — E6609 Other obesity due to excess calories: Secondary | ICD-10-CM | POA: Diagnosis not present

## 2020-11-16 DIAGNOSIS — Z6830 Body mass index (BMI) 30.0-30.9, adult: Secondary | ICD-10-CM

## 2020-11-16 DIAGNOSIS — Z789 Other specified health status: Secondary | ICD-10-CM | POA: Diagnosis not present

## 2020-11-16 DIAGNOSIS — D069 Carcinoma in situ of cervix, unspecified: Secondary | ICD-10-CM | POA: Diagnosis present

## 2020-11-16 NOTE — Progress Notes (Signed)
Anna Thornton 11-Mar-1985 [id number]   History:    35 y.o. 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  No BTB.  No pelvic pain.  No pain with IC.  Using non-latex condoms. Colpo 03/2020: CIN 1.  H/O ASCUS/HPV HR pos.  HPV 16-18-45 Neg.  Colpo 12/08/2019 CIN 2.  Decision to observe rather than perform a LEEP and repeat a Colpo today.  Breasts normal.  Urine/BMs normal.  BMI 30.0.  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  Healthy nutrition.  Past medical history,surgical history, family history and social history were all reviewed and documented in the EPIC chart.  Gynecologic History Patient's last menstrual period was 10/30/2020 (exact date).  Obstetric History OB History  Gravida Para Term Preterm AB Living  0 0 0 0 0 0  SAB IAB Ectopic Multiple Live Births  0 0 0 0 0     ROS: A ROS was performed and pertinent positives and negatives are included in the history.  GENERAL: No fevers or chills. HEENT: No change in vision, no earache, sore throat or sinus congestion. NECK: No pain or stiffness. CARDIOVASCULAR: No chest pain or pressure. No palpitations. PULMONARY: No shortness of breath, cough or wheeze. GASTROINTESTINAL: No abdominal pain, nausea, vomiting or diarrhea, melena or bright red blood per rectum. GENITOURINARY: No urinary frequency, urgency, hesitancy or dysuria. MUSCULOSKELETAL: No joint or muscle pain, no back pain, no recent trauma. DERMATOLOGIC: No rash, no itching, no lesions. ENDOCRINE: No polyuria, polydipsia, no heat or cold intolerance. No recent change in weight. HEMATOLOGICAL: No anemia or easy bruising or bleeding. NEUROLOGIC: No headache, seizures, numbness, tingling or weakness. PSYCHIATRIC: No depression, no loss of interest in normal activity or change in sleep pattern.     Exam:   BP 120/76   Pulse 74    Resp 16   Ht 5' 2.75" (1.594 m)   Wt 168 lb (76.2 kg)   LMP 10/30/2020 (Exact Date)   BMI 30.00 kg/m   Body mass index is 30 kg/m.  General appearance : Well developed well nourished female. No acute distress HEENT: Eyes: no retinal hemorrhage or exudates,  Neck supple, trachea midline, no carotid bruits, no thyroidmegaly Lungs: Clear to auscultation, no rhonchi or wheezes, or rib retractions  Heart: Regular rate and rhythm, no murmurs or gallops Breast:Examined in sitting and supine position were symmetrical in appearance, no palpable masses or tenderness,  no skin retraction, no nipple inversion, no nipple discharge, no skin discoloration, no axillary or supraclavicular lymphadenopathy Abdomen: no palpable masses or tenderness, no rebound or guarding Extremities: no edema or skin discoloration or tenderness  Pelvic: Vulva: Normal             Vagina: No gross lesions or discharge  Cervix: No gross lesions or discharge.  Pap/HR HPV  Uterus  AV, normal size, shape and consistency, non-tender and mobile  Adnexa  Without masses or tenderness  Anus: Normal   Assessment/Plan:  35 y.o. female for annual exam   1. Encounter for routine gynecological examination with Papanicolaou smear of cervix Normal gynecologic exam.  Pap test with high-risk HPV done today given the history of severe dysplasia.  Breast exam normal.  Fasting health labs done here today. - Cytology - PAP( [facility]) - CBC - Comp Met (CMET) - TSH - Lipid Profile -  Vitamin D 1,25 dihydroxy  2. Severe dysplasia of cervix - Cytology - PAP( [facility])  3. Use of condoms for contraception   4. Class 1 obesity due to excess calories without serious comorbidity with body mass index (BMI) of 30.0 to 30.9 in adult  Recommend a lower calorie/carb diet.  Aerobic activities 5 times a week and light weightlifting every 2 days.  Patient has a very physical work which is helpful.  Princess Bruins MD, 9:16 AM 11/16/2020

## 2020-11-19 LAB — VITAMIN D 1,25 DIHYDROXY
Vitamin D 1, 25 (OH)2 Total: 47 pg/mL (ref 18–72)
Vitamin D2 1, 25 (OH)2: <8 pg/mL
Vitamin D3 1, 25 (OH)2: 47 pg/mL

## 2020-11-19 LAB — CBC
HCT: 43.5 % (ref 35.0–45.0)
Hemoglobin: 14.3 g/dL (ref 11.7–15.5)
MCH: 29 pg (ref 27.0–33.0)
MCHC: 32.9 g/dL (ref 32.0–36.0)
MCV: 88.2 fL (ref 80.0–100.0)
MPV: 9.4 fL (ref 7.5–12.5)
Platelets: 319 10*3/uL (ref 140–400)
RBC: 4.93 10*6/uL (ref 3.80–5.10)
RDW: 12.3 % (ref 11.0–15.0)
WBC: 7.1 10*3/uL (ref 3.8–10.8)

## 2020-11-19 LAB — LIPID PANEL
Cholesterol: 186 mg/dL (ref <200)
HDL: 62 mg/dL (ref >=50)
LDL Cholesterol (Calc): 106 mg/dL (calc) — ABNORMAL HIGH
Non-HDL Cholesterol (Calc): 124 mg/dL (calc) (ref <130)
Total CHOL/HDL Ratio: 3 (calc) (ref <5.0)
Triglycerides: 87 mg/dL (ref <150)

## 2020-11-19 LAB — COMPREHENSIVE METABOLIC PANEL
AG Ratio: 1.8 (calc) (ref 1.0–2.5)
ALT: 14 U/L (ref 6–29)
AST: 33 U/L — ABNORMAL HIGH (ref 10–30)
Albumin: 4.6 g/dL (ref 3.6–5.1)
Alkaline phosphatase (APISO): 98 U/L (ref 31–125)
BUN: 12 mg/dL (ref 7–25)
CO2: 24 mmol/L (ref 20–32)
Calcium: 9.9 mg/dL (ref 8.6–10.2)
Chloride: 103 mmol/L (ref 98–110)
Creat: 0.72 mg/dL (ref 0.50–0.97)
Globulin: 2.6 g/dL (calc) (ref 1.9–3.7)
Glucose, Bld: 103 mg/dL — ABNORMAL HIGH (ref 65–99)
Potassium: 4.8 mmol/L (ref 3.5–5.3)
Sodium: 137 mmol/L (ref 135–146)
Total Bilirubin: 0.4 mg/dL (ref 0.2–1.2)
Total Protein: 7.2 g/dL (ref 6.1–8.1)

## 2020-11-19 LAB — TSH: TSH: 0.62 mIU/L

## 2020-11-20 LAB — CYTOLOGY - PAP
Comment: NEGATIVE
Diagnosis: UNDETERMINED — AB
High risk HPV: NEGATIVE

## 2020-11-21 ENCOUNTER — Ambulatory Visit
Admission: RE | Admit: 2020-11-21 | Discharge: 2020-11-21 | Disposition: A | Discharge status: Home / Self Care | Payer: Worker's Compensation | Source: Ambulatory Visit | Attending: Nurse Practitioner | Admitting: Nurse Practitioner

## 2020-11-21 ENCOUNTER — Other Ambulatory Visit: Payer: Self-pay | Admitting: Nurse Practitioner

## 2020-11-21 ENCOUNTER — Other Ambulatory Visit: Payer: Self-pay

## 2020-11-21 DIAGNOSIS — M25571 Pain in right ankle and joints of right foot: Secondary | ICD-10-CM

## 2020-11-21 DIAGNOSIS — M79671 Pain in right foot: Secondary | ICD-10-CM

## 2021-11-20 ENCOUNTER — Ambulatory Visit: Payer: 59 | Admitting: Obstetrics & Gynecology

## 2021-11-28 ENCOUNTER — Other Ambulatory Visit (HOSPITAL_COMMUNITY)
Admission: RE | Admit: 2021-11-28 | Discharge: 2021-11-28 | Disposition: A | Discharge status: Home / Self Care | Payer: 59 | Source: Ambulatory Visit | Attending: Obstetrics & Gynecology | Admitting: Obstetrics & Gynecology

## 2021-11-28 ENCOUNTER — Ambulatory Visit (INDEPENDENT_AMBULATORY_CARE_PROVIDER_SITE_OTHER): Payer: 59 | Admitting: Obstetrics & Gynecology

## 2021-11-28 ENCOUNTER — Encounter: Payer: Self-pay | Admitting: Obstetrics & Gynecology

## 2021-11-28 VITALS — BP 118/78 | HR 84 | Ht 62.25 in | Wt 167.0 lb

## 2021-11-28 DIAGNOSIS — Z113 Encounter for screening for infections with a predominantly sexual mode of transmission: Secondary | ICD-10-CM | POA: Diagnosis not present

## 2021-11-28 DIAGNOSIS — D069 Carcinoma in situ of cervix, unspecified: Secondary | ICD-10-CM | POA: Diagnosis not present

## 2021-11-28 DIAGNOSIS — Z01419 Encounter for gynecological examination (general) (routine) without abnormal findings: Secondary | ICD-10-CM

## 2021-11-28 DIAGNOSIS — Z789 Other specified health status: Secondary | ICD-10-CM

## 2021-11-28 NOTE — Progress Notes (Signed)
Anna Thornton 1985-07-31 [id number]   History:    36 y.o. 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  No BTB.  No pelvic pain.  No pain with IC.  Using non-latex condoms.  Colpo 12/08/2019 CIN2.  HPV 16-18-45 Neg.  Decision to observe rather than perform a LEEP and repeat a Colpo in 03/2020 which showed CIN 1.  Pap ASCUS/HPV HR Neg 11/16/2020.  Pap/HPV HR today.  STI screen at patient's request.  Breasts normal.  Urine/BMs normal.  BMI 30.30. 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  Healthy nutrition.  Past medical history,surgical history, family history and social history were all reviewed and documented in the EPIC chart.  Gynecologic History Patient's last menstrual period was 11/21/2021.  Obstetric History OB History  Gravida Para Term Preterm AB Living  0 0 0 0 0 0  SAB IAB Ectopic Multiple Live Births  0 0 0 0 0    ROS: A ROS was performed and pertinent positives and negatives are included in the history. GENERAL: No fevers or chills. HEENT: No change in vision, no earache, sore throat or sinus congestion. NECK: No pain or stiffness. CARDIOVASCULAR: No chest pain or pressure. No palpitations. PULMONARY: No shortness of breath, cough or wheeze. GASTROINTESTINAL: No abdominal pain, nausea, vomiting or diarrhea, melena or bright red blood per rectum. GENITOURINARY: No urinary frequency, urgency, hesitancy or dysuria. MUSCULOSKELETAL: No joint or muscle pain, no back pain, no recent trauma. DERMATOLOGIC: No rash, no itching, no lesions. ENDOCRINE: No polyuria, polydipsia, no heat or cold intolerance. No recent change in weight. HEMATOLOGICAL: No anemia or easy bruising or bleeding. NEUROLOGIC: No headache, seizures, numbness, tingling or weakness. PSYCHIATRIC: No depression, no loss of interest in normal activity or change in sleep  pattern.     Exam:   BP 118/78   Pulse 84   Ht 5' 2.25" (1.581 m)   Wt 167 lb (75.8 kg)   LMP 11/21/2021   SpO2 99%   BMI 30.30 kg/m   Body mass index is 30.3 kg/m.  General appearance : Well developed well nourished female. No acute distress HEENT: Eyes: no retinal hemorrhage or exudates,  Neck supple, trachea midline, no carotid bruits, no thyroidmegaly Lungs: Clear to auscultation, no rhonchi or wheezes, or rib retractions  Heart: Regular rate and rhythm, no murmurs or gallops Breast:Examined in sitting and supine position were symmetrical in appearance, no palpable masses or tenderness,  no skin retraction, no nipple inversion, no nipple discharge, no skin discoloration, no axillary or supraclavicular lymphadenopathy Abdomen: no palpable masses or tenderness, no rebound or guarding Extremities: no edema or skin discoloration or tenderness  Pelvic: Vulva: Normal             Vagina: No gross lesions or discharge  Cervix: No gross lesions or discharge.  Pap/HPV HR, Gono-Chlam done.  Uterus  AV, normal size, shape and consistency, non-tender and mobile  Adnexa  Without masses or tenderness  Anus: Normal   Assessment/Plan:  36 y.o. female for annual exam   1. Encounter for routine gynecological examination with Papanicolaou smear of cervix Menses regular normal every month.  No BTB.  No pelvic pain.  No pain with IC.  Using non-latex condoms.  Colpo 12/08/2019 CIN2.  HPV 16-18-45 Neg.  Decision to observe rather than perform a LEEP  and repeat a Colpo in 03/2020 which showed CIN 1.  Pap ASCUS/HPV HR Neg 11/16/2020.  Pap/HPV HR today.  STI screen at patient's request.  Breasts normal.  Urine/BMs normal.  BMI 30.30. 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  Healthy nutrition. - Cytology - PAP( [facility])  2. H/O Severe dysplasia of cervix Last Colpo 03/2020 Mild Dysplasia/CIN1. - Cytology - PAP( [facility])  3. Screen for STD (sexually  transmitted disease) - Gono-Chlam on Pap - HIV antibody (with reflex) - RPR - Hepatitis B Surface AntiGEN - Hepatitis C Antibody  4. Use of condoms for contraception  Non-latex.  Princess Bruins MD, 1:29 PM 11/28/2021

## 2021-11-29 LAB — HEPATITIS C ANTIBODY: Hepatitis C Ab: NONREACTIVE

## 2021-11-29 LAB — HIV ANTIBODY (ROUTINE TESTING W REFLEX): HIV 1&2 Ab, 4th Generation: NONREACTIVE

## 2021-11-29 LAB — HEPATITIS B SURFACE ANTIGEN: Hepatitis B Surface Ag: NONREACTIVE

## 2021-11-29 LAB — RPR: RPR Ser Ql: NONREACTIVE

## 2021-11-30 LAB — CYTOLOGY - PAP
Chlamydia: NEGATIVE
Comment: NEGATIVE
Comment: NEGATIVE
Comment: NORMAL
Diagnosis: UNDETERMINED — AB
High risk HPV: NEGATIVE
Neisseria Gonorrhea: NEGATIVE

## 2022-06-05 ENCOUNTER — Ambulatory Visit: Payer: 59 | Admitting: Obstetrics & Gynecology

## 2023-08-18 ENCOUNTER — Other Ambulatory Visit (HOSPITAL_COMMUNITY): Payer: Self-pay

## 2023-08-18 MED ORDER — TRANEXAMIC ACID 650 MG PO TABS
1300.0000 mg | ORAL_TABLET | Freq: Three times a day (TID) | ORAL | 0 refills | Status: DC
  Filled 2023-08-18: qty 30, 5d supply, fill #0

## 2023-09-03 ENCOUNTER — Other Ambulatory Visit (HOSPITAL_BASED_OUTPATIENT_CLINIC_OR_DEPARTMENT_OTHER): Payer: Self-pay

## 2023-09-03 MED ORDER — OXYCODONE HCL 5 MG PO TABS
5.0000 mg | ORAL_TABLET | ORAL | 0 refills | Status: DC | PRN
  Filled 2023-09-03: qty 5, 1d supply, fill #0

## 2023-09-03 NOTE — H&P (Signed)
 pre op, 09-03-2023 surgery 09/10/23 Performed by RUBIE HUSKY, MD, OB/GYN, [phone] Reason for Visit None recorded Assessment & Plan Pre-op exam Start oxyCODONE  5 mg tablet 1 tablet every 4 hours, for breakthrough pain History of Present Illness Pt. here for pre op visit; RATLH/b-salpingectomy scheduled on 07/23  AUB/dysmenorrhea: Pt. c/o heavy cycles with clots, cycles last 7 days but are heavy and painful the 1st 3 days, started several months ago. Regular, monthly.    06/24/23: AUB likely secondary to fibroid uterus. Gyn US  with two fibroids. #1 anterior mid-sagittal intramural, type 2-5 measures 4.5x4.9x4.6cm, and #2 anterior left subserosal type 6 measures 2.3x1.9x1.9cm. Endometrial stripe is deformed by fibroid #1. Discussed US  findings with patient. Tearful, upset about diagnosis of fibroid uterus. Discussed various options including expectant management, TXA, hormonal management, and surgical management. Initially at her appointment, Darcee declines any management at this time and reported that she will call if she changes her mind. The next day, Tye called and said that she desired a hysterectomy. Order placed.    08/18/23: Pt. c/o heavy bleeding since 06/16, was passing golf ball clots and was soaking pad q 1-2 hrs, this is a normal heaviness for 1st 3 days of her cycle, cycles typically last 7 days but this one lasted 1 1/2 weeks, finally ended last Thurs. Discussed that patient does not desire pregnancy in the future, desires hysterectomy. Plan CBC, ferritin for heavy bleeding. TXA bridge sent to cover through hyst.  Allergies: **latex** PMH: none Meds: iron qod. Did not need to take TXA.  PSH: none BMI: 32 Pre-op abx: Ancef and flagyl  VTE ppx: caprini score 3, plan SCDs post-op pain management plan: Has ibuprofen and tylenol  at home. Discussed taking together every 6 hours at home. Sent 5# 5mg  oxycodone .  Labs: most recent Hgb 11.3, ferritin 8. Normal pap, HPV neg  05/29/23  Imaging: 06/24/2023 Gyn US  for AUB. Enlarged, anteverted uterus with two fibroids. #1 anterior mid-sagittal intramural, type 2-5 measures 4.5x4.9x4.6cm, and #2 anterior left subserosal type 6 measures 2.3x1.9x1.9cm. Endometrial stripe is deformed by fibroid #1. Measures 4.85mm. Bilateral ovaries are visualized and normal appearing.  Today we discussed the risks, benefits, and alternatives of the robotic assisted total laparoscopic hysterectomy, bilateral salpingectomy, cystoscopy, and other indicated procedures. Specific procedure risks discussed included risk of bleeding, infection (wound infection or pelvic infection) and damage to other structures. Specifically discussed risk of damage to blood vessels, nerves, bowel, bladder, and ureters. She understands that any of the above could necessitate further laparoscopy or laparotomy intraop, as well as additional treatment or surgeries (either on the day of surgery or possibly in the future). In terms of risk of bleeding, pt accepts a blood transfusion if necessary. After discussion of the above, pt wishes to proceed with the scheduled procedure.  needs FMLA ASAP Review of Systems ROS as noted in the HPI Screening None recorded Physical Exam Annual Gyn  Chaperone Chaperone: present  Constitutional General Appearance: healthy-appearing, well-nourished, well-developed  Psychiatric Orientation: to time, to place, to person Mood and Affect: active and alert, normal mood, normal affect  Skin Appearance: no rashes, no lesions  Lungs Respiratory Effort: no intercostal retractions, no accessory muscle usage  Cardiovascular Peripheral Vascular: (well perfused)  Female Genitalia Vulva: no masses, no atrophy, no lesions Vagina: no tenderness, no erythema, no abnormal vaginal discharge, no vesicle(s) or ulcers, no cystocele, no rectocele Cervix: grossly normal, no discharge, no cervical motion tenderness Uterus: normal shape, midline, no  uterine prolapse, mobile, non-tender, enlarged (11w sized)  Bladder/Urethra: normal meatus, no urethral discharge, no urethral mass, bladder non distended, Urethra well supported Adnexa/Parametria: no parametrial tenderness, no parametrial mass, no adnexal tenderness, no ovarian mass

## 2023-09-04 ENCOUNTER — Other Ambulatory Visit (HOSPITAL_COMMUNITY): Payer: Self-pay

## 2023-09-04 ENCOUNTER — Encounter (HOSPITAL_COMMUNITY): Payer: Self-pay | Admitting: Obstetrics and Gynecology

## 2023-09-04 NOTE — Pre-Procedure Instructions (Signed)
 Surgical Instructions   --Your procedure is scheduled on :  Wednesday,  09-10-2023 Report to [facility] Main Entrance A at 10:30  A.M., then check in with the Admitting office. Any questions or running late day of surgery: call [phone]  Questions prior to your surgery date: call [phone], Monday-Friday, 8am-4pm. If you experience any cold or flu symptoms such as cough, fever, chills, shortness of breath, etc. between now and your scheduled surgery, please notify your surgeon office.    Remember:  Do not eat any food after midnight the night before your surgery.  You may have clear liquids from midnight night before surgery until 9:30 AM.   Clear liquids allowed are:  Water Carbonated Beverages Clear Tea (no milk, honey, etc.) Black Coffee Only (NO MILK, CREAM OR POWDERED CREAMER of any kind) Sport drinks,  like Gatorade.  NO clear liquids after 9:30 AM day of surgery.  This includes no water,  candy,  gum,  and  mints.    Take these medicines the morning of surgery with A SIP OF WATER :  none   May take these medicines IF NEEDED: none    One week prior to surgery, STOP taking any Aspirin (unless otherwise instructed by your surgeon) Aleve , Naproxen , Ibuprofen, Motrin, Advil, Goody's, BC's, all herbal medications, fish oil, and non-prescription vitamins.                     Do NOT Smoke (Tobacco/Vaping) and Do Not no alcohol for 24 hours prior to your procedure.  If you use a CPAP at night, you may bring your mask/headgear for your overnight stay.   You will be asked to remove any contacts, glasses, piercing's, hearing aid's, dentures/partials prior to surgery. Please bring cases for these items if needed.    Patients discharged the day of surgery will not be allowed to drive home, and someone needs to stay with them for 24 hours. - SURGICAL WAITING ROOM VISITATION Patients may have no more than 2 support people in the waiting area - these visitors may rotate.    Pre-op nurse will coordinate an appropriate time for 1 ADULT support person, who may not rotate, to accompany patient in pre-op.  Children under the age of 38 must have an adult with them who is not the patient and must remain in the main waiting area with an adult.  If the patient needs to stay at the hospital during part of their recovery, the visitor guidelines for inpatient rooms apply.  Please refer to the [facility] website for the visitor guidelines for any additional information.   If you received a COVID test during your pre-op visit  it is requested that you wear a mask when out in public, stay away from anyone that may not be feeling well and notify your surgeon if you develop symptoms. If you have been in contact with anyone that has tested positive in the last 10 days please notify you surgeon.      Pre-operative CHG Bathing Instructions   You can play a key role in reducing the risk of infection after surgery. Your skin needs to be as free of germs as possible. You can reduce the number of germs on your skin by washing with CHG (chlorhexidine gluconate) soap before surgery. CHG is an antiseptic soap that kills germs and continues to kill germs even after washing.   DO NOT use if you have an allergy to chlorhexidine/CHG or antibacterial soaps. If your skin  becomes reddened or irritated, stop using the CHG and notify Pre-op nurse day of surgery.             TAKE A SHOWER THE NIGHT BEFORE SURGERY AND THE DAY OF SURGERY    Please keep in mind the following:  DO NOT shave, including legs and underarms, 48 hours prior to surgery.   You may shave your face before/day of surgery.  Place clean sheets on your bed the night before surgery Use a clean washcloth (not used since being washed) for each shower. DO NOT sleep with pet's night before surgery.  CHG Shower Instructions:  Wash your face and private area with normal soap. If you choose to wash your hair, wash first with your  normal shampoo.  After you use shampoo/soap, rinse your hair and body thoroughly to remove shampoo/soap residue.  Turn the water OFF and apply half the bottle of CHG soap to a CLEAN washcloth.  Apply CHG soap ONLY FROM YOUR NECK DOWN TO YOUR TOES (washing for 3-5 minutes)  DO NOT use CHG soap on face, private areas, open wounds, or sores.  Pay special attention to the area where your surgery is being performed.  If you are having back surgery, having someone wash your back for you may be helpful. Wait 2 minutes after CHG soap is applied, then you may rinse off the CHG soap.  Pat dry with a clean towel  Put on clean pajamas    Additional instructions for the day of surgery: DO NOT APPLY any lotions,  powder,  oils,  deodorants (may use underarm deodorant) , cologne/  perfumes  or makeup Do not wear jewelry / piercing's/ metal/ permanent jewelry must be removed prior to arrival day of surgery.  (No plastic piercing) Do not wear nail polish, gel polish, artificial nails, or any other type of covering on natural finger nails (toe nails are okay) Do not bring valuables to the hospital. [facility] is not responsible for valuables/personal belongings. Put on clean/comfortable clothes.  Please brush your teeth.  Ask your nurse before applying any prescription medications to the skin.

## 2023-09-04 NOTE — Progress Notes (Signed)
 Spoke w/ via phone for pre-op interview--- pt Lab needs dos----  upt       Lab results------ lab appt 09-08-2023 @ 0900 getting CBC/ T&S COVID test -----patient states asymptomatic no test needed Arrive at ------- 1030 on 09-10-2023 NPO after MN NO Solid Food.  Clear liquids from MN until--- 0930 Pre-Surgery Ensure or G2: n/a  Med rec completed Medications to take morning of surgery ----- none Diabetic medication ----- n/a  GLP1 agonist last dose: n/a GLP1 instructions:  Patient instructed no nail polish to be worn day of surgery Patient instructed to bring photo id and insurance card day of surgery Patient aware to have Driver (ride ) / caregiver    for 24 hours after surgery - mother, donzell sharps Patient Special Instructions ----- will pick up bag w/ soap and written instructions at lab appt Pre-Op special Instructions ----- n/a  Patient verbalized understanding of instructions that were given at this phone interview. Patient denies chest pain, sob, fever, cough at the interview.

## 2023-09-08 ENCOUNTER — Encounter (HOSPITAL_COMMUNITY)
Admission: RE | Admit: 2023-09-08 | Discharge: 2023-09-08 | Disposition: A | Discharge status: Home / Self Care | Source: Ambulatory Visit | Attending: Obstetrics and Gynecology

## 2023-09-08 DIAGNOSIS — Z01818 Encounter for other preprocedural examination: Secondary | ICD-10-CM

## 2023-09-08 DIAGNOSIS — Z01812 Encounter for preprocedural laboratory examination: Secondary | ICD-10-CM | POA: Insufficient documentation

## 2023-09-08 LAB — CBC
HCT: 38.1 % (ref 36.0–46.0)
Hemoglobin: 12.5 g/dL (ref 12.0–15.0)
MCH: 28.2 pg (ref 26.0–34.0)
MCHC: 32.8 g/dL (ref 30.0–36.0)
MCV: 85.8 fL (ref 80.0–100.0)
Platelets: 307 K/uL (ref 150–400)
RBC: 4.44 MIL/uL (ref 3.87–5.11)
RDW: 14.5 % (ref 11.5–15.5)
WBC: 6.2 K/uL (ref 4.0–10.5)
nRBC: 0 % (ref 0.0–0.2)

## 2023-09-08 LAB — TYPE AND SCREEN
ABO/RH(D): O NEG
Antibody Screen: NEGATIVE

## 2023-09-10 ENCOUNTER — Encounter (HOSPITAL_COMMUNITY): Payer: Self-pay | Admitting: Obstetrics and Gynecology

## 2023-09-10 ENCOUNTER — Other Ambulatory Visit: Payer: Self-pay

## 2023-09-10 ENCOUNTER — Ambulatory Visit (HOSPITAL_COMMUNITY)
Admission: RE | Admit: 2023-09-10 | Discharge: 2023-09-10 | Disposition: A | Discharge status: Home / Self Care | Attending: Obstetrics and Gynecology | Admitting: Obstetrics and Gynecology

## 2023-09-10 ENCOUNTER — Encounter (HOSPITAL_COMMUNITY)
Admission: RE | Disposition: A | Discharge status: Home / Self Care | Payer: Self-pay | Source: Home / Self Care | Attending: Obstetrics and Gynecology

## 2023-09-10 ENCOUNTER — Ambulatory Visit (HOSPITAL_COMMUNITY): Admitting: Anesthesiology

## 2023-09-10 DIAGNOSIS — N939 Abnormal uterine and vaginal bleeding, unspecified: Secondary | ICD-10-CM | POA: Insufficient documentation

## 2023-09-10 DIAGNOSIS — N888 Other specified noninflammatory disorders of cervix uteri: Secondary | ICD-10-CM | POA: Diagnosis not present

## 2023-09-10 DIAGNOSIS — D252 Subserosal leiomyoma of uterus: Secondary | ICD-10-CM | POA: Diagnosis not present

## 2023-09-10 DIAGNOSIS — Z01818 Encounter for other preprocedural examination: Secondary | ICD-10-CM

## 2023-09-10 DIAGNOSIS — D251 Intramural leiomyoma of uterus: Secondary | ICD-10-CM | POA: Insufficient documentation

## 2023-09-10 DIAGNOSIS — Z9889 Other specified postprocedural states: Secondary | ICD-10-CM

## 2023-09-10 HISTORY — PX: CYSTOSCOPY: SHX5120

## 2023-09-10 HISTORY — DX: Excessive and frequent menstruation with regular cycle: N92.0

## 2023-09-10 HISTORY — DX: Unspecified osteoarthritis, unspecified site: M19.90

## 2023-09-10 HISTORY — DX: Leiomyoma of uterus, unspecified: D25.9

## 2023-09-10 HISTORY — PX: ROBOTIC ASSISTED TOTAL HYSTERECTOMY WITH BILATERAL SALPINGO OOPHERECTOMY: SHX6086

## 2023-09-10 HISTORY — DX: Presence of spectacles and contact lenses: Z97.3

## 2023-09-10 HISTORY — DX: Abnormal uterine and vaginal bleeding, unspecified: N93.9

## 2023-09-10 LAB — POCT PREGNANCY, URINE: Preg Test, Ur: NEGATIVE

## 2023-09-10 LAB — ABO/RH: ABO/RH(D): O NEG

## 2023-09-10 SURGERY — HYSTERECTOMY, TOTAL, ROBOT-ASSISTED, LAPAROSCOPIC, WITH BILATERAL SALPINGO-OOPHORECTOMY
Anesthesia: General

## 2023-09-10 MED ORDER — CEFAZOLIN SODIUM-DEXTROSE 2-4 GM/100ML-% IV SOLN
INTRAVENOUS | Status: AC
  Filled 2023-09-10: qty 100

## 2023-09-10 MED ORDER — ORAL CARE MOUTH RINSE
15.0000 mL | Freq: Once | OROMUCOSAL | Status: AC
Start: 2023-09-10 — End: 2023-09-10

## 2023-09-10 MED ORDER — LACTATED RINGERS IV SOLN: INTRAVENOUS | Status: DC

## 2023-09-10 MED ORDER — CHLORHEXIDINE GLUCONATE 0.12 % MT SOLN
15.0000 mL | Freq: Once | OROMUCOSAL | Status: AC
  Administered 2023-09-10: 15 mL via OROMUCOSAL

## 2023-09-10 MED ORDER — DEXMEDETOMIDINE HCL IN NACL 80 MCG/20ML IV SOLN
INTRAVENOUS | Status: DC | PRN
Start: 2023-09-10 — End: 2023-09-10
  Administered 2023-09-10: 10 ug via INTRAVENOUS

## 2023-09-10 MED ORDER — PROPOFOL 10 MG/ML IV BOLUS
INTRAVENOUS | Status: AC
  Filled 2023-09-10: qty 20

## 2023-09-10 MED ORDER — ROCURONIUM BROMIDE 10 MG/ML (PF) SYRINGE
PREFILLED_SYRINGE | INTRAVENOUS | Status: DC | PRN
  Administered 2023-09-10: 40 mg via INTRAVENOUS
  Administered 2023-09-10: 20 mg via INTRAVENOUS
  Administered 2023-09-10: 70 mg via INTRAVENOUS
  Administered 2023-09-10: 10 mg via INTRAVENOUS

## 2023-09-10 MED ORDER — DEXAMETHASONE SODIUM PHOSPHATE 10 MG/ML IJ SOLN
INTRAMUSCULAR | Status: DC | PRN
  Administered 2023-09-10: 10 mg via INTRAVENOUS

## 2023-09-10 MED ORDER — ACETAMINOPHEN 500 MG PO TABS
1000.0000 mg | ORAL_TABLET | ORAL | Status: AC
  Administered 2023-09-10: 1000 mg via ORAL

## 2023-09-10 MED ORDER — FENTANYL CITRATE (PF) 250 MCG/5ML IJ SOLN
INTRAMUSCULAR | Status: DC | PRN
  Administered 2023-09-10: 50 ug via INTRAVENOUS
  Administered 2023-09-10 (×2): 100 ug via INTRAVENOUS

## 2023-09-10 MED ORDER — LIDOCAINE 2% (20 MG/ML) 5 ML SYRINGE
INTRAMUSCULAR | Status: AC
  Filled 2023-09-10: qty 5

## 2023-09-10 MED ORDER — ACETAMINOPHEN 500 MG PO TABS
ORAL_TABLET | ORAL | Status: AC
  Filled 2023-09-10: qty 2

## 2023-09-10 MED ORDER — ONDANSETRON HCL 4 MG/2ML IJ SOLN
INTRAMUSCULAR | Status: AC
Start: 2023-09-10 — End: 2023-09-10
  Filled 2023-09-10: qty 2

## 2023-09-10 MED ORDER — MIDAZOLAM HCL 2 MG/2ML IJ SOLN
INTRAMUSCULAR | Status: AC
  Filled 2023-09-10: qty 2

## 2023-09-10 MED ORDER — MIDAZOLAM HCL 2 MG/2ML IJ SOLN
INTRAMUSCULAR | Status: DC | PRN
  Administered 2023-09-10: 2 mg via INTRAVENOUS

## 2023-09-10 MED ORDER — ROPIVACAINE HCL 5 MG/ML IJ SOLN
INTRAMUSCULAR | Status: AC
  Filled 2023-09-10: qty 30

## 2023-09-10 MED ORDER — METRONIDAZOLE 500 MG/100ML IV SOLN
500.0000 mg | Freq: Once | INTRAVENOUS | Status: AC
  Administered 2023-09-10: 500 mg via INTRAVENOUS

## 2023-09-10 MED ORDER — SODIUM CHLORIDE (PF) 0.9 % IJ SOLN
INTRAMUSCULAR | Status: AC
  Filled 2023-09-10: qty 50

## 2023-09-10 MED ORDER — POVIDONE-IODINE 10 % EX SWAB
2.0000 | Freq: Once | CUTANEOUS | Status: AC
  Administered 2023-09-10: 2 via TOPICAL

## 2023-09-10 MED ORDER — SUGAMMADEX SODIUM 200 MG/2ML IV SOLN
INTRAVENOUS | Status: AC
  Filled 2023-09-10: qty 2

## 2023-09-10 MED ORDER — PROPOFOL 10 MG/ML IV BOLUS
INTRAVENOUS | Status: DC | PRN
  Administered 2023-09-10: 200 mg via INTRAVENOUS
  Administered 2023-09-10: 150 ug/kg/min via INTRAVENOUS

## 2023-09-10 MED ORDER — PHENYLEPHRINE 80 MCG/ML (10ML) SYRINGE FOR IV PUSH (FOR BLOOD PRESSURE SUPPORT)
PREFILLED_SYRINGE | INTRAVENOUS | Status: AC
  Filled 2023-09-10: qty 10

## 2023-09-10 MED ORDER — SUGAMMADEX SODIUM 200 MG/2ML IV SOLN
INTRAVENOUS | Status: DC | PRN
  Administered 2023-09-10: 200 mg via INTRAVENOUS

## 2023-09-10 MED ORDER — DEXAMETHASONE SODIUM PHOSPHATE 10 MG/ML IJ SOLN
INTRAMUSCULAR | Status: AC
  Filled 2023-09-10: qty 1

## 2023-09-10 MED ORDER — HYDROMORPHONE HCL 1 MG/ML IJ SOLN: 0.2500 mg | INTRAMUSCULAR | Status: DC | PRN

## 2023-09-10 MED ORDER — BUPIVACAINE HCL (PF) 0.25 % IJ SOLN
INTRAMUSCULAR | Status: DC | PRN
  Administered 2023-09-10: 10 mL

## 2023-09-10 MED ORDER — METRONIDAZOLE 500 MG/100ML IV SOLN
INTRAVENOUS | Status: AC
  Filled 2023-09-10: qty 100

## 2023-09-10 MED ORDER — FENTANYL CITRATE (PF) 250 MCG/5ML IJ SOLN
INTRAMUSCULAR | Status: AC
  Filled 2023-09-10: qty 5

## 2023-09-10 MED ORDER — CEFAZOLIN SODIUM-DEXTROSE 2-4 GM/100ML-% IV SOLN
2.0000 g | INTRAVENOUS | Status: AC
Start: 2023-09-10 — End: 2023-09-10
  Administered 2023-09-10: 2 g via INTRAVENOUS

## 2023-09-10 MED ORDER — DROPERIDOL 2.5 MG/ML IJ SOLN: 0.6250 mg | Freq: Once | INTRAMUSCULAR | Status: DC | PRN

## 2023-09-10 MED ORDER — SODIUM CHLORIDE (PF) 0.9 % IJ SOLN
INTRAMUSCULAR | Status: AC
  Filled 2023-09-10: qty 10

## 2023-09-10 MED ORDER — FLUORESCEIN SODIUM 10 % IV SOLN
INTRAVENOUS | Status: AC
  Filled 2023-09-10: qty 5

## 2023-09-10 MED ORDER — ROCURONIUM BROMIDE 10 MG/ML (PF) SYRINGE
PREFILLED_SYRINGE | INTRAVENOUS | Status: AC
Start: 2023-09-10 — End: 2023-09-10
  Filled 2023-09-10: qty 10

## 2023-09-10 MED ORDER — SOD CITRATE-CITRIC ACID 500-334 MG/5ML PO SOLN: 30.0000 mL | ORAL | Status: DC

## 2023-09-10 MED ORDER — CELECOXIB 200 MG PO CAPS
ORAL_CAPSULE | ORAL | Status: AC
  Filled 2023-09-10: qty 2

## 2023-09-10 MED ORDER — FLUORESCEIN SODIUM 10 % IV SOLN
INTRAVENOUS | Status: DC | PRN
Start: 2023-09-10 — End: 2023-09-10
  Administered 2023-09-10: 20 mg via INTRAVENOUS

## 2023-09-10 MED ORDER — 0.9 % SODIUM CHLORIDE (POUR BTL) OPTIME
TOPICAL | Status: DC | PRN
  Administered 2023-09-10: 1000 mL

## 2023-09-10 MED ORDER — ONDANSETRON HCL 4 MG/2ML IJ SOLN
INTRAMUSCULAR | Status: DC | PRN
Start: 2023-09-10 — End: 2023-09-10
  Administered 2023-09-10: 4 mg via INTRAVENOUS

## 2023-09-10 MED ORDER — KETOROLAC TROMETHAMINE 30 MG/ML IJ SOLN
INTRAMUSCULAR | Status: AC
  Filled 2023-09-10: qty 1

## 2023-09-10 MED ORDER — SODIUM CHLORIDE 0.9 % IV SOLN
INTRAVENOUS | Status: DC | PRN
  Administered 2023-09-10: 60 mL

## 2023-09-10 MED ORDER — CHLORHEXIDINE GLUCONATE 0.12 % MT SOLN
OROMUCOSAL | Status: AC
  Filled 2023-09-10: qty 15

## 2023-09-10 MED ORDER — SODIUM CHLORIDE 0.9 % IR SOLN
Status: DC | PRN
  Administered 2023-09-10: 800 mL

## 2023-09-10 MED ORDER — LIDOCAINE 2% (20 MG/ML) 5 ML SYRINGE
INTRAMUSCULAR | Status: DC | PRN
Start: 2023-09-10 — End: 2023-09-10
  Administered 2023-09-10: 100 mg via INTRAVENOUS

## 2023-09-10 MED ORDER — BUPIVACAINE HCL (PF) 0.25 % IJ SOLN
INTRAMUSCULAR | Status: AC
  Filled 2023-09-10: qty 30

## 2023-09-10 MED ORDER — CELECOXIB 200 MG PO CAPS
400.0000 mg | ORAL_CAPSULE | ORAL | Status: AC
  Administered 2023-09-10: 400 mg via ORAL

## 2023-09-10 SURGICAL SUPPLY — 46 items
COVER BACK TABLE 60X90IN (DRAPES) ×2 IMPLANT
COVER TIP SHEARS 8 DVNC (MISCELLANEOUS) ×2 IMPLANT
DEFOGGER SCOPE WARM SEASHARP (MISCELLANEOUS) ×2 IMPLANT
DERMABOND ADVANCED .7 DNX12 (GAUZE/BANDAGES/DRESSINGS) ×2 IMPLANT
DRAPE ARM DVNC X/XI (DISPOSABLE) ×8 IMPLANT
DRAPE COLUMN DVNC XI (DISPOSABLE) ×2 IMPLANT
DRAPE UTILITY XL STRL (DRAPES) ×2 IMPLANT
DRIVER NDL MEGA SUTCUT DVNCXI (INSTRUMENTS) ×2 IMPLANT
DRIVER NDLE MEGA SUTCUT DVNCXI (INSTRUMENTS) ×1 IMPLANT
DURAPREP 26ML APPLICATOR (WOUND CARE) ×2 IMPLANT
ELECTRODE REM PT RTRN 9FT ADLT (ELECTROSURGICAL) ×2 IMPLANT
FORCEPS BPLR FENES DVNC XI (FORCEP) ×2 IMPLANT
GLOVE BIOGEL PI IND STRL 7.0 (GLOVE) ×6 IMPLANT
GLOVE BIOGEL PI IND STRL 7.5 (GLOVE) IMPLANT
GLOVE SURG SS PI 6.5 STRL IVOR (GLOVE) IMPLANT
GLOVE SURG SS PI 7.0 STRL IVOR (GLOVE) IMPLANT
GOWN STRL REUS W/ TWL LRG LVL3 (GOWN DISPOSABLE) IMPLANT
GOWN STRL SURGICAL XL XLNG (GOWN DISPOSABLE) IMPLANT
IRRIGATION SUCT STRKRFLW 2 WTP (MISCELLANEOUS) ×2 IMPLANT
IV NS 1000ML BAXH (IV SOLUTION) IMPLANT
KIT PINK PAD W/HEAD ARM REST (MISCELLANEOUS) ×2 IMPLANT
KIT TURNOVER KIT B (KITS) ×2 IMPLANT
LEGGING LITHOTOMY PAIR STRL (DRAPES) ×2 IMPLANT
MANIPULATOR ADVINCU DEL 3.5 PL (MISCELLANEOUS) IMPLANT
NDL INSUFFLATION 14GA 120MM (NEEDLE) ×2 IMPLANT
NEEDLE INSUFFLATION 14GA 120MM (NEEDLE) ×1 IMPLANT
NS IRRIG 1000ML POUR BTL (IV SOLUTION) IMPLANT
OBTURATOR OPTICALSTD 8 DVNC (TROCAR) ×2 IMPLANT
PACK ROBOT WH (CUSTOM PROCEDURE TRAY) ×2 IMPLANT
PACK ROBOTIC GOWN (GOWN DISPOSABLE) ×2 IMPLANT
PAD OB MATERNITY 11 LF (PERSONAL CARE ITEMS) ×2 IMPLANT
PAD PREP 24X48 CUFFED NSTRL (MISCELLANEOUS) ×2 IMPLANT
PROTECTOR NERVE ULNAR (MISCELLANEOUS) ×2 IMPLANT
SCISSORS MNPLR CVD DVNC XI (INSTRUMENTS) ×2 IMPLANT
SEAL UNIV 5-12 XI (MISCELLANEOUS) ×6 IMPLANT
SET IRRIG Y TYPE TUR BLADDER L (SET/KITS/TRAYS/PACK) ×2 IMPLANT
SET TRI-LUMEN FLTR TB AIRSEAL (TUBING) ×2 IMPLANT
SLEEVE SCD COMPRESS KNEE MED (STOCKING) ×2 IMPLANT
SPIKE FLUID TRANSFER (MISCELLANEOUS) ×2 IMPLANT
SUT MNCRL AB 4-0 PS2 18 (SUTURE) ×4 IMPLANT
SUT VIC AB 0 CT1 36 (SUTURE) ×2 IMPLANT
SUT VLOC 180 0 9IN GS21 (SUTURE) ×2 IMPLANT
TOWEL GREEN STERILE (TOWEL DISPOSABLE) ×2 IMPLANT
TRAY FOLEY W/BAG SLVR 14FR (SET/KITS/TRAYS/PACK) ×2 IMPLANT
TRAY FOLEY W/BAG SLVR 14FR LF (SET/KITS/TRAYS/PACK) IMPLANT
TROCAR PORT AIRSEAL 5X120 (TROCAR) ×2 IMPLANT

## 2023-09-10 NOTE — Discharge Instructions (Addendum)
 Nothing in the vagina for 6 weeks. No bath- shower ok. Increase fiber and hydration. Tylenol  and ibuprofen together every 6 hours for pain. Oxycodone  as needed every four hours for breakthrough pain. Colace over the counter for stool softener if needed. Gas-ex for gas or right shoulder pain from referred gas.  Post Anesthesia Home Care Instructions  Activity: Get plenty of rest for the remainder of the day. A responsible adult should stay with you for 24 hours following the procedure.  For the next 24 hours, DO NOT: -Drive a car -Advertising copywriter -Drink alcoholic beverages -Take any medication unless instructed by your physician -Make any legal decisions or sign important papers.  Meals: Start with liquid foods such as gelatin or soup. Progress to regular foods as tolerated. Avoid greasy, spicy, heavy foods. If nausea and/or vomiting occur, drink only clear liquids until the nausea and/or vomiting subsides. Call your physician if vomiting continues.  Special Instructions/Symptoms: Your throat may feel dry or sore from the anesthesia or the breathing tube placed in your throat during surgery. If this causes discomfort, gargle with warm salt water. The discomfort should disappear within 24 hours.

## 2023-09-10 NOTE — Op Note (Signed)
 09/10/2023  1:18 PM  PATIENT:  Anna Thornton  38 y.o. female  PRE-OPERATIVE DIAGNOSIS:  abnormal uterine bleeding, fibroid uterus  POST-OPERATIVE DIAGNOSIS:  abnormal uterine bleeding, fibroid uterus, post-operative state  PROCEDURE:  Procedure(s): HYSTERECTOMY, TOTAL, ROBOT-ASSISTED, LAPAROSCOPIC, WITH BILATERAL SALPIGECTOMY (N/A) CYSTOSCOPY (N/A)  SURGEON:  Surgeons and Role:    * Claire Rubie LABOR, MD - Primary    * Sarrah Browning, MD - Assisting  ANESTHESIA:   local and general  EBL:  50 mL   BLOOD ADMINISTERED:none  DRAINS: none   LOCAL MEDICATIONS USED:  MARCAINE     and OTHER ropivacaine   SPECIMEN:  uterus, cervix, bilateral fallopian tubes  DISPOSITION OF SPECIMEN:  PATHOLOGY  COUNTS:  correct x2  Findings: Large fibroid uterus. Left fallopian tube with thin serosal adhesions. Normal right fallopian tube. Normal bilateral ovaries. Noted hyperemia of pelvic serosa.   Details of the surgery:  After adequate anesthesia was achieved the patient was positioned, prepped and draped in usual sterile fashion.  A routine surgical time out was performed. A speculum was placed in the vagina. The uterus was sounded to 10cm. The  4cm Koh ring Advincula was assembled and placed in proper fashion. The speculum was removed. The bladder catheterized with a foley.   Attention was turned to the abdomen where a 0.5cm incision was made at the cranial aspect of the umbilicus.  The veress needle was introduced without aspiration of bowel contents or blood. The abdomen was insufflated with CO2 gas and then the camera was introduced, confirming intraperitoneal insufflation and no injury to underlying structures. The 8.2mm Robotic trocar was placed at the umbilicus and the other three trocar sites were marked out, all approximately 10 cm from each other and the camera.  Two 8.5 mm trocars were placed on either side of the camera port and a 5 mm assistant port was placed 3 cm above the line of  the other trocars.  All trocars were inserted under direct visualization of the camera.  The patient was placed in trendelenburg and then the Robot docked.  The fenestrated bipolar were placed on arm 2 and the monopolar scissors on arm 4 and introduced under direct visualization of the camera.   I then broke scrub and sat down at the console.  The above findings were noted and the ureters identified well out of the field of dissection. The left fallopian tube was ligated from the mesosalpinx using electrocautery. It was ligated from the uterus at the cornua and placed in the posterior cul-de-sac. The left utero-ovarian ligament was then cauterized and ligated with good hemostasis. The left  round ligament was then cauterized and ligated. A bladder flap was created using sharp and blunt dissection. The left broad and cardinal ligaments were then cauterized against the cervix to the level of the Koh ring, securing the uterine artery. The right fallopian tube was then ligated from the mesosalpinx using electrocautery. It was ligated from the uterus at the cornua and removed from the body. The right utero-ovarian ligament was then cauterized and ligated with good hemostasis. The right round ligament was then cauterized and ligated. A right side of the bladder flap was completed using sharp and blunt dissection. The right broad and cardinal ligaments were then cauterized against the cervix to the level of the Koh ring, securing the right uterine artery. The pedicle was then incised with the shears. The left pedicle was then incised with the shears. The ureters were identified well out of the field of  dissection.     The monopolar scissors were used to circumferentially incised the vagina at the level of the reflection on the [facility] ring.  After the uterus and cervix were amputated, a fibroid was removed from the uterus so that it could be removed through the vagina. The uterus and cervix, then the fibroid, and finally  the left fallopian tube were removed from the body through the vagina. Copious irrigation of the cuff was performed. Cautery was used to ensure hemostasis of the cuff. Once hemostasis was achieved, the scissors were changed to the mega suture cut needle driver and the cuff was closed with a running 0-vicryl V loc suture.  Cautery was used to ensure hemostasis of the bilateral pedicles very superficially. The ureters were noted to be peristalsing well bilaterally. The intra-abdominal insufflation was reduced to with good hemostasis noted of the cuff.   I scrubbed in and removed the foley. I introduced the cystoscope into the bladder, revealing an intact bladder and vigourous spill of urine from each ureteral orifice.  The cystoscope was removed. No instruments were in the vagina at this point. I palpated the top of the vagina and found the cuff to be intact. I repaired a superficial left hymenal laceration with 4-0 monocryl in a running fashion.   50cc of Ropivacaine  was introduced into the pelvis through the robot port. All trocars were then removed after the abdominal insufflation had been removed by the AirSeal and the patient was given three deep manual breaths by anesthesia. The skin incisions were infused with Marcaine  and closed with subcuticular stitches of 3-0 Monocryl and Dermabond.   The patient tolerate the procedure well and was in stable condition, being woken up by anesthesia when I left the room. She was then taken to the recovery room in stable condition.   Rubie Husky, MD

## 2023-09-10 NOTE — Anesthesia Procedure Notes (Signed)
 Procedure Name: Intubation Date/Time: 09/10/2023 10:58 AM  Performed by: Viviana Almarie DASEN, CRNAPre-anesthesia Checklist: Patient identified, Emergency Drugs available, Suction available and Patient being monitored Patient Re-evaluated:Patient Re-evaluated prior to induction Oxygen Delivery Method: Circle system utilized Preoxygenation: Pre-oxygenation with 100% oxygen Induction Type: IV induction Ventilation: Mask ventilation without difficulty Laryngoscope Size: Mac and 3 Grade View: Grade I Tube type: Oral Tube size: 7.0 mm Number of attempts: 1 Airway Equipment and Method: Stylet and Bite block Placement Confirmation: ETT inserted through vocal cords under direct vision, positive ETCO2 and breath sounds checked- equal and bilateral Secured at: 21 cm Tube secured with: Tape Dental Injury: Teeth and Oropharynx as per pre-operative assessment

## 2023-09-10 NOTE — Progress Notes (Signed)
 Updated Pre-Op H&P  The patient was seen and examined by me. She is feeling well today. She denies any changes to her health history or medications. We briefly went over risks of the procedure again. All questions were answered.  Vitals:   09/10/23 0953  BP: (!) 132/98  Pulse: 83  Resp: 17  Temp: 98.5 F (36.9 C)  SpO2: 98%     Exam: Gen- NAD, A+Ox3 Cardiac- well perfused Resp- normal WOB on room air Extr- no trauma Psych- normal mood and affect  Hgb 12.5 Platelets 301 UPT neg   A/P Move forward with surgery as planned. Mother Donzell is support person at the bedside.

## 2023-09-10 NOTE — Discharge Instr - Supplementary Instructions (Signed)
 May take Tylenol  after 4:30pm if needed for discomfort.  May take Ibuprofen,Advil or Aleve  after 4:30pm if needed for discomfort.

## 2023-09-10 NOTE — Discharge Summary (Addendum)
 Physician Discharge Summary  Patient ID: Anna Thornton MRN: [id number] DOB/AGE: 09-08-1985 38 y.o.  Admit date: 09/10/2023 Discharge date: 09/10/2023  Admission Diagnoses: abnormal uterine bleeding, fibroids  Discharge Diagnoses: post-op hysterectomy Principal Problem:   Post-operative state   Discharged Condition: good  Hospital Course: Uncomplicated robot-assisted total laparoscopic hysterectomy, bilateral salpingectomy, and cystoscopy. Per PACU RN, patient was meeting all post-op goals (voiding, tolerating PO, pain well controlled, able to ambulate, normal/stable vital signs) in PACU and desired discharge. Patient has post-op visit scheduled with Dr. Claire in 2 weeks.   Discharge Exam: Blood pressure (!) 152/91, pulse (!) 58, temperature 99.2 F (37.3 C), resp. rate 20, height 5' 2 (1.575 m), weight 78.5 kg, last menstrual period 09/08/2023, SpO2 100%.   Disposition: Discharge disposition: 01-Home or Self Care        Allergies as of 09/10/2023       Reactions   Latex Rash        Medication List     ASK your doctor about these medications    oxyCODONE  5 MG immediate release tablet Commonly known as: Oxy IR/ROXICODONE  Take 1 tablet (5 mg total) by mouth every 4 (four) hours as needed for breakthrough pain.   tranexamic acid  650 MG Tabs tablet Commonly known as: LYSTEDA  Take 1,300 mg by mouth as directed. Take when start menses   TURMERIC PO Take 1 capsule by mouth daily.        Follow-up Information     Claire Rubie LABOR, MD. Go in 2 week(s).   Specialty: Obstetrics and Gynecology Contact information: [address] [place] [address] [phone]                 Signed: Rubie LABOR Claire 09/11/2023, 2:15 PM  Addendum to correct discharge date.

## 2023-09-10 NOTE — Anesthesia Preprocedure Evaluation (Addendum)
 Anesthesia Evaluation  Patient identified by MRN, date of birth, ID band Patient awake    Reviewed: Allergy & Precautions, NPO status , Patient's Chart, lab work & pertinent test results  Airway Mallampati: II  TM Distance: >3 FB Neck ROM: Full    Dental  (+) Dental Advisory Given, Teeth Intact, Chipped,    Pulmonary neg pulmonary ROS   Pulmonary exam normal breath sounds clear to auscultation       Cardiovascular negative cardio ROS Normal cardiovascular exam Rhythm:Regular Rate:Normal     Neuro/Psych  PSYCHIATRIC DISORDERS  Depression    negative neurological ROS     GI/Hepatic negative GI ROS, Neg liver ROS,neg GERD  ,,  Endo/Other  negative endocrine ROS    Renal/GU negative Renal ROS     Musculoskeletal  (+) Arthritis ,    Abdominal  (+) + obese  Peds  Hematology negative hematology ROS (+)   Anesthesia Other Findings   Reproductive/Obstetrics                              Anesthesia Physical Anesthesia Plan  ASA: 2  Anesthesia Plan: General   Post-op Pain Management: Tylenol  PO (pre-op)* and Celebrex  PO (pre-op)*   Induction: Intravenous  PONV Risk Score and Plan: 4 or greater and Ondansetron , Dexamethasone , Treatment may vary due to age or medical condition, Midazolam , TIVA and Propofol  infusion  Airway Management Planned: Oral ETT  Additional Equipment:   Intra-op Plan:   Post-operative Plan: Extubation in OR  Informed Consent: I have reviewed the patients History and Physical, chart, labs and discussed the procedure including the risks, benefits and alternatives for the proposed anesthesia with the patient or authorized representative who has indicated his/her understanding and acceptance.     Dental advisory given  Plan Discussed with: CRNA  Anesthesia Plan Comments:          Anesthesia Quick Evaluation

## 2023-09-10 NOTE — Transfer of Care (Signed)
 Immediate Anesthesia Transfer of Care Note  Patient: Anna Thornton  Procedure(s) Performed: HYSTERECTOMY, TOTAL, ROBOT-ASSISTED, LAPAROSCOPIC, WITH BILATERAL SALPIGECTOMY CYSTOSCOPY  Patient Location: PACU  Anesthesia Type:General  Level of Consciousness: oriented and patient cooperative  Airway & Oxygen Therapy: Patient Spontanous Breathing and Patient connected to face mask oxygen  Post-op Assessment: Report given to RN, Post -op Vital signs reviewed and stable, Patient moving all extremities X 4, and Patient able to stick tongue midline  Post vital signs: Reviewed and stable  Last Vitals:  Vitals Value Taken Time  BP 122/92 09/10/23 13:26  Temp 98.3   Pulse 71 09/10/23 13:27  Resp 21 09/10/23 13:27  SpO2 99 % 09/10/23 13:27  Vitals shown include unfiled device data.  Last Pain:  Vitals:   09/10/23 0953  TempSrc: Oral  PainSc: 3       Patients Stated Pain Goal: 4 (09/10/23 0953)  Complications: No notable events documented.

## 2023-09-11 ENCOUNTER — Encounter (HOSPITAL_COMMUNITY): Payer: Self-pay | Admitting: Obstetrics and Gynecology

## 2023-09-11 NOTE — Anesthesia Postprocedure Evaluation (Signed)
 Anesthesia Post Note  Patient: Anna Thornton  Procedure(s) Performed: HYSTERECTOMY, TOTAL, ROBOT-ASSISTED, LAPAROSCOPIC, WITH BILATERAL SALPIGECTOMY CYSTOSCOPY     Patient location during evaluation: PACU Anesthesia Type: General Level of consciousness: sedated and patient cooperative Pain management: pain level controlled Vital Signs Assessment: post-procedure vital signs reviewed and stable Respiratory status: spontaneous breathing Cardiovascular status: stable Anesthetic complications: no   No notable events documented.  Last Vitals:  Vitals:   09/10/23 1414 09/10/23 1415  BP:  (!) 152/91  Pulse: (!) 58 (!) 58  Resp: (!) 22 20  Temp: 37.3 C   SpO2: 98% 100%    Last Pain:  Vitals:   09/10/23 0953  TempSrc: Oral  PainSc: 3                  Norleen Pope

## 2023-09-12 LAB — SURGICAL PATHOLOGY

## 2023-09-22 ENCOUNTER — Ambulatory Visit: Payer: Self-pay | Admitting: Obstetrics and Gynecology

## 2023-12-29 ENCOUNTER — Other Ambulatory Visit (HOSPITAL_COMMUNITY): Payer: Self-pay

## 2023-12-29 MED ORDER — PREDNISONE 10 MG PO TABS
ORAL_TABLET | ORAL | 0 refills | Status: AC
Start: 2023-12-29 — End: ?
  Filled 2023-12-29: qty 21, 10d supply, fill #0
